# Patient Record
Sex: Female | Born: 1965 | Race: White | Hispanic: Yes | Marital: Married | Smoking: Never smoker
Health system: Southern US, Community
[De-identification: ages and names within clinical notes are randomized; demographics above are authoritative.]

## PROBLEM LIST (undated history)

## (undated) DIAGNOSIS — N200 Calculus of kidney: Secondary | ICD-10-CM

## (undated) HISTORY — PX: TUBAL LIGATION: SHX77

---

## 2011-12-16 ENCOUNTER — Ambulatory Visit (INDEPENDENT_AMBULATORY_CARE_PROVIDER_SITE_OTHER): Payer: Self-pay | Admitting: Physician Assistant

## 2011-12-16 ENCOUNTER — Encounter: Payer: Self-pay | Admitting: Physician Assistant

## 2011-12-16 VITALS — BP 156/93 | HR 107 | Temp 98.7°F | Ht 64.0 in | Wt 203.3 lb

## 2011-12-16 DIAGNOSIS — R300 Dysuria: Secondary | ICD-10-CM

## 2011-12-16 DIAGNOSIS — E119 Type 2 diabetes mellitus without complications: Secondary | ICD-10-CM | POA: Insufficient documentation

## 2011-12-16 MED ORDER — SULFAMETHOXAZOLE-TRIMETHOPRIM 800-160 MG PO TABS: 1.0000 | ORAL_TABLET | Freq: Two times a day (BID) | ORAL | Status: AC

## 2011-12-16 NOTE — Progress Notes (Signed)
Chief Complaint:  Follow-up   Angel Bradshaw is  46 y.o. G3P3003.  Patient's last menstrual period was 12/07/2011.. She presents complaining of Follow-up  Pt presents stating that she was told to FU in [facility] because her pap smear was abnormal. Review of records reveal NIL pap on 10/15/2012. 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  Pt reports no s/s at present, however, states that she has vaginal "bumps" and irritation with itching 2 days before period begins. Reports burning with urination x 1 week.  Obstetrical/Gynecological History: OB History    Grav Para Term Preterm Abortions TAB SAB Ect Mult Living   3 3 3       3       Past Medical History: Past Medical History  Diagnosis Date  . Diabetes mellitus     Past Surgical History: Past Surgical History  Procedure Date  . Tubal ligation     Family History: Family History  Problem Relation Age of Onset  . Diabetes Mother     Social History: History  Substance Use Topics  . Smoking status: Not on file  . Smokeless tobacco: Never Used  . Alcohol Use: No    Allergies: Allergies no known allergies   Review of Systems - Negative except what has been reviewed in the HPI  Physical Exam   Blood pressure 156/93, pulse 107, temperature 98.7 F (37.1 C), temperature source Oral, height 5\' 4"  (1.626 m), weight 203 lb 4.8 oz (92.216 kg), last menstrual period 12/07/2011.  General: General appearance - alert, well appearing, and in no distress, oriented to person, place, and time and normal appearing weight Mental status - alert, oriented to person, place, and time, normal mood, behavior, speech, dress, motor activity, and thought processes, affect appropriate to mood Abdomen - soft, nontender, nondistended, no masses or organomegaly no CVA tenderness Pelvic - normal external genitalia, vulva, vagina, cervix, uterus and adnexa, no s/s of warts or lesions on perineum   Assessment:  Dysuria Patient Active Problem List  Diagnoses  . Diabetes mellitus    Plan: Rx Bactrum DS 1 po BID x 3 days Urine C&S Reassurance given of NL pap. Baking soda soaks prn comfort with s/s.   Angel Marsalis E. 12/16/2011,3:02 PM

## 2011-12-16 NOTE — Patient Instructions (Signed)
Dolor plvico (Pelvic Pain) Es el dolor que se ubica debajo del ombligo, Center las caderas. El dolor agudo puede durar algunas horas o [place]. El dolor plvico crnico puede durar algunas semanas o meses. Las causas de los diferentes tipos de dolor pueden ser varias. El dolor puede ser sordo o [place], leve o intenso y puede interferir con las actividades diarias. Antelo y [place] a su mdico.   Exactamente donde se [place].   Si viene y se va, o permanece todo [address]   Cuando aparece (al [employer] sexuales, al orinar, al mover el intestino, etc.)   Si se relaciona con el perodo menstrual o situaciones de Librarian, academic.  El mdico le har una historia clnica [place], un examen fsico y un [place]. CAUSAS  Perodos menstruales dolorosos (dismenorrea).   Ovulacin normal, que ocurre en la mitad del perodo menstrual todos los meses.   Los rganos plvicos que se congestionan de sangre justo antes del perodo menstrual (sndrome congestivo plvico).   Tejido cicatrizal consecuencia de una infeccin o cirugas anteriores (adherencias plvicas).   Cncer en los rganos plvicos femeninos. Cuando el dolor es por cncer, ha aparecido hace mucho tiempo.   El tejido que tapiza internamente al tero (endometrio) crece de manera anormal en lugares como la pelvis y en los rganos plvicos (endometriosis),   Una forma de endometriosis con el tejido que recubre el interior del tero situado dentro de su tejido muscular (adenomiosis).   Tumores fibroides (no cancerosos) del tero.   Problemas en la vejiga, como infecciones, espasmos del tejido muscular de la vejiga.   Problemas intestinales (colon irritable, colitis, lcera o infeccin gastrointestinal).   Plipos en el cuello del tero.   Embarazo en las trompas (embarazo ectpico).   La apertura del cuello del tero es demasiado pequea para que fluya la sangre menstrual (estenosis cervical).   Maltratos fsicos o sexuales  (pasados o presentes).   Problemas msculoesquelticos debidos a Engineer, water, problemas con las vrtebras de la zona baja de la espalda o cada de los msculos de la pelvis (prolapso).   Problemas psicolgicos como depresin o estrs.   DIU (dispositivo intrauterino).  DIAGNSTICO Las pruebas para Sales executive diagnstico dependen del tipo, [place], gravedad y causa del Engineer, mining. Las pruebas necesarias son:  Anlisis de [place].   Anlisis de [place].   Ecografa.   Radiografas.   Tomografa computada.   MRI (imagen por resonancia magntica).   Laparoscopa.   Ciruga mayor.  TRATAMIENTO El tratamiento depender de la causa del dolor, el que incluye:  Analgsicos prescriptos o de [place].   Antibiticos:   Anticonceptivos.   Tratamiento hormonal.   Inyecciones para bloquear la conduccin nerviosa.   Fisioterapia.   Antidepresivos.   Psicoterapia con un psiquiatra o un psiclogo.   Ciruga mayor o menor.  INSTRUCCIONES PARA EL CUIDADO DOMICILIARIO  Slo tome medicamentos de venta libre o prescriptos para Primary school teacher, las molestias o bajar la fiebre segn las indicaciones de su mdico.   Siga los consejos de su mdico para tratar Chief Technology Officer.   Reposo.   Evite las relaciones sexuales si le Teaching laboratory technician.   Aplique compresas calientes o fras (que siempre son mejores) en la zona dolorida.   Haga ejercicios de relajacin, como yoga o meditacin.   Trate con acupuntura.   Evite las situaciones estresantes.   Trate con psicoterapia grupal.   Si el dolor se debe a molestias intestinales o estomacales, beba lquidos claros, siga una dieta blanda [employer]  que los sntomas desaparezcan.  SOLICITE ANTENCIN MDICA SI:  Necesita analgsicos ms fuertes.   Tiene dolor durante las [employer].   Tiene dolor al [employer].   La temperatura se eleva por encima de 102 F (38.9 C) cuando siente dolor.   An siente dolor luego de 4 horas e haber tomado  los analgsicos prescriptos.   Necesita antidepresivos.   El DIU le causa dolor y Teacher, English as a foreign language.  SOLICITE ATENCIN MDICA DE INMEDIATO SI:  Presenta dolor muy intenso.   Presenta se desmaya, tiene escalofros, debilidad extrema o deshidratacin.   Observa una hemorragia vaginal abundante o elimina tejidos.   Presenta la temperatura se eleva por encima de 102 F (38.9 C) cuando siente dolor.   Observa sangre en la orina.   Ha sufrido maltratos fsicos o sexuales.   Sufre vmitos y diarrea y no puede controlarlos.   Siente depresin y teme daarse a usted mismo o a Economist.  Document Released: 11/15/2005 Document Revised: 07/28/2011 [facility] Patient Information 2012 [place], [place].Pelvic Pain Pelvic pain is pain below the belly button and located between your hips. Acute pain may last a few hours or days. Chronic pelvic pain may last weeks and months. The cause may be different for different types of pain. The pain may be dull or sharp, mild or severe and can interfere with your daily activities. Write down and tell your caregiver:   Exactly where the pain is located.   If it comes and goes or is there all the time.   When it happens (with sex, urination, bowel movement, etc.)   If the pain is related to your menstrual period or stress.  Your caregiver will take a full history and do a complete physical exam and Pap test. CAUSES   Painful menstrual periods (dysmenorrhea).   Normal ovulation ([place]) that occurs in the middle of the menstrual cycle every month.   The pelvic organs get engorged with blood just before the menstrual period (pelvic congestive syndrome).   Scar tissue from an infection or past surgery (pelvic adhesions).   Cancer of the female pelvic organs. When there is pain with cancer, it has been there for a long time.   The lining of the uterus (endometrium) abnormally grows in places like the pelvis and on the pelvic organs  (endometriosis).   A form of endometriosis with the lining of the uterus present inside of the muscle tissue of the uterus (adenomyosis).   Fibroid tumor (noncancerous) in the uterus.   Bladder problems such as infection, bladder spasms of the muscle tissue of the bladder.   Intestinal problems (irritable bowel syndrome, colitis, an ulcer or gastrointestinal infection).   Polyps of the cervix or uterus.   Pregnancy in the tube (ectopic pregnancy).   The opening of the cervix is too small for the menstrual blood to flow through it (cervical stenosis).   Physical or sexual abuse (past or present).   Musculo-skeletal problems from poor posture, problems with the vertebrae of the lower back or the uterine pelvic muscles falling (prolapse).   Psychological problems such as depression or stress.   IUD (intrauterine device) in the uterus.  DIAGNOSIS  Tests to make a diagnosis depends on the type, location, severity and what causes the pain to occur. Tests that may be needed include:  Blood tests.   Urine tests   Ultrasound.   X-rays.   CT Scan.   MRI.   Laparoscopy.   Major surgery.  TREATMENT  Treatment will  depend on the cause of the pain, which includes:  Prescription or over-the-counter pain medication.   Antibiotics.   Birth control pills.   Hormone treatment.   Nerve blocking injections.   Physical therapy.   Antidepressants.   Counseling with a psychiatrist or psychologist.   Minor or major surgery.  HOME CARE INSTRUCTIONS   Only take over-the-counter or prescription medicines for pain, discomfort or fever as directed by your caregiver.   Follow your caregiver's advice to treat your pain.   Rest.   Avoid sexual intercourse if it causes the pain.   Apply warm or cold compresses (which ever works best) to the pain area.   Do relaxation exercises such as yoga or meditation.   Try acupuncture.   Avoid stressful situations.   Try group  therapy.   If the pain is because of a stomach/intestinal upset, drink clear liquids, eat a bland light food diet until the symptoms go away.  SEEK MEDICAL CARE IF:   You need stronger prescription pain medication.   You develop pain with sexual intercourse.   You have pain with urination.   You develop a temperature of 102 F (38.9 C) with the pain.   You are still in pain after 4 hours of taking prescription medication for the pain.   You need depression medication.   Your IUD is causing pain and you want it removed.  SEEK IMMEDIATE MEDICAL CARE IF:  You develop very severe pain or tenderness.   You faint, have chills, severe weakness or dehydration.   You develop heavy vaginal bleeding or passing solid tissue.   You develop a temperature of 102 F (38.9 C) with the pain.   You have blood in the urine.   You are being physically or sexually abused.   You have uncontrolled vomiting and diarrhea.   You are depressed and afraid of harming yourself or someone else.  Document Released: 12/23/2004 Document Revised: 07/28/2011 Document Reviewed: 09/19/2008 [facility] Patient Information 2012 [place], [place].

## 2011-12-19 LAB — URINE CULTURE

## 2013-10-18 ENCOUNTER — Inpatient Hospital Stay (HOSPITAL_BASED_OUTPATIENT_CLINIC_OR_DEPARTMENT_OTHER)
Admission: EM | Admit: 2013-10-18 | Discharge: 2013-10-20 | DRG: 419 | Disposition: A | Discharge status: Home / Self Care | Payer: BC Managed Care – PPO | Attending: General Surgery | Admitting: General Surgery

## 2013-10-18 ENCOUNTER — Emergency Department (HOSPITAL_BASED_OUTPATIENT_CLINIC_OR_DEPARTMENT_OTHER): Payer: BC Managed Care – PPO

## 2013-10-18 ENCOUNTER — Encounter (HOSPITAL_BASED_OUTPATIENT_CLINIC_OR_DEPARTMENT_OTHER): Payer: Self-pay | Admitting: Emergency Medicine

## 2013-10-18 DIAGNOSIS — E119 Type 2 diabetes mellitus without complications: Secondary | ICD-10-CM | POA: Diagnosis present

## 2013-10-18 DIAGNOSIS — K432 Incisional hernia without obstruction or gangrene: Secondary | ICD-10-CM | POA: Diagnosis present

## 2013-10-18 DIAGNOSIS — K8010 Calculus of gallbladder with chronic cholecystitis without obstruction: Principal | ICD-10-CM | POA: Diagnosis present

## 2013-10-18 DIAGNOSIS — K8020 Calculus of gallbladder without cholecystitis without obstruction: Secondary | ICD-10-CM

## 2013-10-18 DIAGNOSIS — E041 Nontoxic single thyroid nodule: Secondary | ICD-10-CM

## 2013-10-18 DIAGNOSIS — N390 Urinary tract infection, site not specified: Secondary | ICD-10-CM | POA: Diagnosis present

## 2013-10-18 DIAGNOSIS — E669 Obesity, unspecified: Secondary | ICD-10-CM | POA: Diagnosis present

## 2013-10-18 DIAGNOSIS — Z833 Family history of diabetes mellitus: Secondary | ICD-10-CM

## 2013-10-18 DIAGNOSIS — K812 Acute cholecystitis with chronic cholecystitis: Secondary | ICD-10-CM | POA: Diagnosis present

## 2013-10-18 DIAGNOSIS — K429 Umbilical hernia without obstruction or gangrene: Secondary | ICD-10-CM | POA: Diagnosis present

## 2013-10-18 DIAGNOSIS — Z9851 Tubal ligation status: Secondary | ICD-10-CM

## 2013-10-18 DIAGNOSIS — R12 Heartburn: Secondary | ICD-10-CM

## 2013-10-18 LAB — URINALYSIS, ROUTINE W REFLEX MICROSCOPIC
Bilirubin Urine: NEGATIVE
Ketones, ur: NEGATIVE mg/dL
Nitrite: NEGATIVE
Protein, ur: NEGATIVE mg/dL

## 2013-10-18 LAB — COMPREHENSIVE METABOLIC PANEL
BUN: 10 mg/dL (ref 6–23)
CO2: 24 mEq/L (ref 19–32)
Calcium: 9.5 mg/dL (ref 8.4–10.5)
Creatinine, Ser: 0.5 mg/dL (ref 0.50–1.10)
GFR calc Af Amer: >90 mL/min (ref >90)
GFR calc non Af Amer: >90 mL/min (ref >90)
Glucose, Bld: 126 mg/dL — ABNORMAL HIGH (ref 70–99)
Potassium: 3.7 mEq/L (ref 3.5–5.1)
Total Protein: 7.8 g/dL (ref 6.0–8.3)

## 2013-10-18 LAB — URINE MICROSCOPIC-ADD ON

## 2013-10-18 LAB — CBC WITH DIFFERENTIAL/PLATELET
Basophils Relative: 0 % (ref 0–1)
Eosinophils Absolute: 0.1 10*3/uL (ref 0.0–0.7)
Eosinophils Relative: 1 % (ref 0–5)
HCT: 40.3 % (ref 36.0–46.0)
Lymphocytes Relative: 25 % (ref 12–46)
Lymphs Abs: 2.9 10*3/uL (ref 0.7–4.0)
MCH: 27.2 pg (ref 26.0–34.0)
MCHC: 33.5 g/dL (ref 30.0–36.0)
MCV: 81.3 fL (ref 78.0–100.0)
Monocytes Absolute: 0.7 10*3/uL (ref 0.1–1.0)
Neutrophils Relative %: 69 % (ref 43–77)
Platelets: 338 10*3/uL (ref 150–400)
RBC: 4.96 MIL/uL (ref 3.87–5.11)
RDW: 13.5 % (ref 11.5–15.5)
WBC: 11.6 10*3/uL — ABNORMAL HIGH (ref 4.0–10.5)

## 2013-10-18 LAB — GLUCOSE, CAPILLARY: Glucose-Capillary: 103 mg/dL — ABNORMAL HIGH (ref 70–99)

## 2013-10-18 LAB — POCT I-STAT 3, VENOUS BLOOD GAS (G3P V)
Acid-Base Excess: 1 mmol/L (ref 0.0–2.0)
Bicarbonate: 24.8 mEq/L — ABNORMAL HIGH (ref 20.0–24.0)
O2 Saturation: 82 %
Patient temperature: 98.6

## 2013-10-18 LAB — TROPONIN I: Troponin I: <0.3 ng/mL (ref <0.30)

## 2013-10-18 LAB — KETONES, QUALITATIVE: Acetone, Bld: NEGATIVE

## 2013-10-18 LAB — LIPASE, BLOOD: Lipase: 38 U/L (ref 11–59)

## 2013-10-18 LAB — CG4 I-STAT (LACTIC ACID): Lactic Acid, Venous: 1.1 mmol/L (ref 0.5–2.2)

## 2013-10-18 MED ORDER — PIPERACILLIN-TAZOBACTAM 3.375 G IVPB
3.3750 g | Freq: Once | INTRAVENOUS | Status: AC
  Administered 2013-10-18: 3.375 g via INTRAVENOUS
  Filled 2013-10-18 (×2): qty 50

## 2013-10-18 MED ORDER — IOHEXOL 300 MG/ML  SOLN
50.0000 mL | Freq: Once | INTRAMUSCULAR | Status: AC | PRN
  Administered 2013-10-18: 50 mL via ORAL

## 2013-10-18 MED ORDER — DEXTROSE 5 % IV SOLN
1.0000 g | Freq: Once | INTRAVENOUS | Status: AC
  Administered 2013-10-18: 1 g via INTRAVENOUS

## 2013-10-18 MED ORDER — ONDANSETRON HCL 4 MG/2ML IJ SOLN
4.0000 mg | Freq: Once | INTRAMUSCULAR | Status: AC
  Administered 2013-10-18: 4 mg via INTRAVENOUS
  Filled 2013-10-18: qty 2

## 2013-10-18 MED ORDER — IOHEXOL 350 MG/ML SOLN
100.0000 mL | Freq: Once | INTRAVENOUS | Status: AC | PRN
  Administered 2013-10-18: 100 mL via INTRAVENOUS

## 2013-10-18 MED ORDER — CEFTRIAXONE SODIUM 1 G IJ SOLR
INTRAMUSCULAR | Status: AC
  Filled 2013-10-18: qty 10

## 2013-10-18 MED ORDER — SODIUM CHLORIDE 0.9 % IV BOLUS (SEPSIS)
1000.0000 mL | Freq: Once | INTRAVENOUS | Status: AC
  Administered 2013-10-18: 1000 mL via INTRAVENOUS

## 2013-10-18 MED ORDER — MORPHINE SULFATE 4 MG/ML IJ SOLN
4.0000 mg | Freq: Once | INTRAMUSCULAR | Status: AC
  Administered 2013-10-18: 4 mg via INTRAVENOUS
  Filled 2013-10-18: qty 1

## 2013-10-18 NOTE — ED Notes (Signed)
Epigastric pain and pain in her left flank. Denies dysuria.

## 2013-10-18 NOTE — ED Notes (Signed)
Report given to Shawn RN with [facility] and RN in charge at [facility].

## 2013-10-18 NOTE — ED Provider Notes (Signed)
CSN: [id number]     Arrival date & time 10/18/13  1727 History   First MD Initiated Contact with Patient 10/18/13 1738     Chief Complaint  Patient presents with  . Abdominal Pain   (Consider location/radiation/quality/duration/timing/severity/associated sxs/prior Treatment) HPI Comments: Patient presents with three-day history of epigastric and upper abdominal pain is intermittent. It started 2 days ago progressed throughout the day, will wait overnight and came back during the day. Nothing makes the pain better or worse. Today she had one episode of vomiting. Denies any fevers, chills, dysuria hematuria. Denies any vaginal bleeding or discharge. Denies any chest pain or shortness of breath. She has not had pain like this in the past.   The history is provided by the patient.    Past Medical History  Diagnosis Date  . Diabetes mellitus    Past Surgical History  Procedure Laterality Date  . Tubal ligation     Family History  Problem Relation Age of Onset  . Diabetes Mother    History  Substance Use Topics  . Smoking status: Never Smoker   . Smokeless tobacco: Never Used  . Alcohol Use: No   OB History   Grav Para Term Preterm Abortions TAB SAB Ect Mult Living   3 3 3       3      Review of Systems  Constitutional: Negative for fever, activity change and appetite change.  HENT: Negative for rhinorrhea.   Eyes: Negative for visual disturbance.  Respiratory: Negative for cough and shortness of breath.   Cardiovascular: Negative for chest pain.  Gastrointestinal: Positive for nausea, vomiting and abdominal pain. Negative for diarrhea and constipation.  Genitourinary: Negative for dysuria, hematuria, vaginal bleeding and vaginal discharge.  Musculoskeletal: Negative for back pain.  Skin: Negative for rash.  Neurological: Negative for dizziness, weakness and headaches.  A complete 10 system review of systems was obtained and all systems are negative except as noted in the HPI  and PMH.    Allergies  Review of patient's allergies indicates no known allergies.  Home Medications   Current Outpatient Rx  Name  Route  Sig  Dispense  Refill  . Canagliflozin (INVOKANA) 100 MG TABS   Oral   Take by mouth.         . sitaGLIPtin (JANUVIA) 100 MG tablet   Oral   Take 100 mg by mouth daily.         . fish oil-omega-3 fatty acids 1000 MG capsule   Oral   Take 1 g by mouth daily.         . metFORMIN (GLUCOPHAGE) 1000 MG tablet   Oral   Take 1,000 mg by mouth 2 (two) times daily with a meal.          BP 139/82  Pulse 83  Temp(Src) 98.6 F (37 C) (Oral)  Resp 20  Wt 178 lb (80.74 kg)  SpO2 100%  LMP 10/02/2013 Physical Exam  Constitutional: She is oriented to person, place, and time. She appears well-developed and well-nourished. No distress.  HENT:  Head: Normocephalic and atraumatic.  Mouth/Throat: Oropharynx is clear and moist. No oropharyngeal exudate.  Eyes: Conjunctivae and EOM are normal. Pupils are equal, round, and reactive to light.  Neck: Normal range of motion. Neck supple.  Cardiovascular: Normal rate, regular rhythm and normal heart sounds.   No murmur heard. Pulmonary/Chest: Effort normal and breath sounds normal. No respiratory distress. She exhibits no tenderness.  tachycardic  Abdominal: Soft. There is  tenderness. There is no rebound and no guarding.  TTP epigastrum, RUQ, LUQ Soft No lower abdominal tenderness  Musculoskeletal: Normal range of motion. She exhibits no edema and no tenderness.  No CVAT  Neurological: She is alert and oriented to person, place, and time. No cranial nerve deficit. She exhibits normal muscle tone. Coordination normal.  Skin: Skin is warm.    ED Course  Procedures (including critical care time) Labs Review Labs Reviewed  URINALYSIS, ROUTINE W REFLEX MICROSCOPIC - Abnormal; Notable for the following:    APPearance CLOUDY (*)    Glucose, UA >1000 (*)    Hgb urine dipstick TRACE (*)     Leukocytes, UA MODERATE (*)    All other components within normal limits  CBC WITH DIFFERENTIAL - Abnormal; Notable for the following:    WBC 11.6 (*)    Neutro Abs 7.9 (*)    All other components within normal limits  COMPREHENSIVE METABOLIC PANEL - Abnormal; Notable for the following:    Glucose, Bld 126 (*)    Total Bilirubin 0.1 (*)    All other components within normal limits  GLUCOSE, CAPILLARY - Abnormal; Notable for the following:    Glucose-Capillary 118 (*)    All other components within normal limits  GLUCOSE, CAPILLARY - Abnormal; Notable for the following:    Glucose-Capillary 103 (*)    All other components within normal limits  POCT I-STAT 3, BLOOD GAS (G3P V) - Abnormal; Notable for the following:    pH, Ven 7.429 (*)    pCO2, Ven 37.4 (*)    Bicarbonate 24.8 (*)    All other components within normal limits  URINE CULTURE  PREGNANCY, URINE  LIPASE, BLOOD  KETONES, QUALITATIVE  TROPONIN I  URINE MICROSCOPIC-ADD ON  CG4 I-STAT (LACTIC ACID)   Imaging Review Ct Angio Chest Pe W/cm &/or Wo Cm  10/18/2013   CLINICAL DATA:  Epigastric pain. Pleuritic left flank pain. Abdominal pain.  EXAM: CT ANGIOGRAPHY CHEST  CT ABDOMEN AND PELVIS WITH CONTRAST  TECHNIQUE: Multidetector CT imaging of the chest was performed using the standard protocol during bolus administration of intravenous contrast. Multiplanar CT image reconstructions including MIPs were obtained to evaluate the vascular anatomy. Multidetector CT imaging of the abdomen and pelvis was performed using the standard protocol during bolus administration of intravenous contrast.  CONTRAST:  50mL OMNIPAQUE IOHEXOL 300 MG/ML SOLN, OMNIPAQUE IOHEXOL 350 MG/ML SOLN  COMPARISON:  10/18/2013 abdominal ultrasound.  FINDINGS: CTA CHEST FINDINGS  Low-density prominence in the left thyroid lobe measures 1.7 cm and in the right thyroid lobe measures 1.8 cm.  No filling defect is identified in the pulmonary arterial tree to  suggest pulmonary embolus. No acute aortic findings. No pericardial effusion. No pathologic thoracic adenopathy.  The lungs appear clear.  No pleural effusion.  CT ABDOMEN and PELVIS FINDINGS  The liver, spleen, pancreas, and adrenal glands appear unremarkable. Borderline gallbladder wall thickening. Kidneys and proximal ureters unremarkable. No pathologic upper abdominal adenopathy is observed. Small umbilical hernia contains adipose tissue. Appendix normal.  2.5 cm fluid density lesion along the left ovary margin. Urinary bladder mildly distended but otherwise unremarkable.  Posterior osseous ridging noted at L5-S1, with foraminal components contributing to mild foraminal stenosis.  Review of the MIP images confirms the above findings.  IMPRESSION: 1. Bilateral low-density thyroid lesions. Consider further evaluation with thyroid ultrasound. If patient is clinically hyperthyroid, consider nuclear medicine thyroid uptake and scan. 2. Borderline gallbladder wall thickening. 3. 2.5 cm fluid density lesion  of the left ovary. Based on size criteria, this does not require followup. This recommendation follows ACR consensus guidelines: White Paper of the ACR Incidental Findings Committee II on Adnexal Findings. J Am Coll Radiol [phone]. 4. Posterior osseous ridging at L5-S1, likely causing mild bilateral foraminal stenosis. 5. Small umbilical hernia contains adipose tissue.   Electronically Signed   By: Herbie Baltimore M.D.   On: 10/18/2013 22:00   US Abdomen Complete  10/18/2013   CLINICAL DATA:  Right upper quadrant/epigastric pain. History of diabetes.  EXAM: ULTRASOUND ABDOMEN COMPLETE  COMPARISON:  None.  FINDINGS: Gallbladder  numerous gallstones with posterior shadowing, each measuring up to about 1.6 cm in diameter. Borderline gallbladder wall thickening at 3 mm. No pericholecystic fluid. Sonographic Murphy's sign: Absent.  Common bile duct  Diameter: 4 mm, within normal limits.  Liver  No focal  lesion identified. Within normal limits in parenchymal echogenicity.  IVC  Limited visualization due to overlying bowel gas.  .  Pancreas  Limited visualization due to overlying bowel gas; visualized portion of pancreatic head and body unremarkable. Marland Kitchen  Spleen  Size and appearance within normal limits.  Right Kidney  Length: 13.9 cm. Echogenicity within normal limits. No mass or hydronephrosis visualized.  Left Kidney  Length: 13.0 cm. Echogenicity within normal limits. No mass or hydronephrosis visualized.  Abdominal aorta  No aneurysm visualized.  IMPRESSION: 1. Cholelithiasis, with borderline gallbladder wall thickening. No pericholecystic fluid or sonographic Murphy sign. Correlate clinically in assessing for acute cholecystitis. 2. Overlying bowel gas causes limited visualization of the IVC and pancreas.   Electronically Signed   By: Herbie Baltimore M.D.   On: 10/18/2013 18:55   Ct Abdomen Pelvis W Contrast  10/18/2013   CLINICAL DATA:  Epigastric pain. Pleuritic left flank pain. Abdominal pain.  EXAM: CT ANGIOGRAPHY CHEST  CT ABDOMEN AND PELVIS WITH CONTRAST  TECHNIQUE: Multidetector CT imaging of the chest was performed using the standard protocol during bolus administration of intravenous contrast. Multiplanar CT image reconstructions including MIPs were obtained to evaluate the vascular anatomy. Multidetector CT imaging of the abdomen and pelvis was performed using the standard protocol during bolus administration of intravenous contrast.  CONTRAST:  50mL OMNIPAQUE IOHEXOL 300 MG/ML SOLN, OMNIPAQUE IOHEXOL 350 MG/ML SOLN  COMPARISON:  10/18/2013 abdominal ultrasound.  FINDINGS: CTA CHEST FINDINGS  Low-density prominence in the left thyroid lobe measures 1.7 cm and in the right thyroid lobe measures 1.8 cm.  No filling defect is identified in the pulmonary arterial tree to suggest pulmonary embolus. No acute aortic findings. No pericardial effusion. No pathologic thoracic adenopathy.  The lungs  appear clear.  No pleural effusion.  CT ABDOMEN and PELVIS FINDINGS  The liver, spleen, pancreas, and adrenal glands appear unremarkable. Borderline gallbladder wall thickening. Kidneys and proximal ureters unremarkable. No pathologic upper abdominal adenopathy is observed. Small umbilical hernia contains adipose tissue. Appendix normal.  2.5 cm fluid density lesion along the left ovary margin. Urinary bladder mildly distended but otherwise unremarkable.  Posterior osseous ridging noted at L5-S1, with foraminal components contributing to mild foraminal stenosis.  Review of the MIP images confirms the above findings.  IMPRESSION: 1. Bilateral low-density thyroid lesions. Consider further evaluation with thyroid ultrasound. If patient is clinically hyperthyroid, consider nuclear medicine thyroid uptake and scan. 2. Borderline gallbladder wall thickening. 3. 2.5 cm fluid density lesion of the left ovary. Based on size criteria, this does not require followup. This recommendation follows ACR consensus guidelines: White Paper of the ACR Incidental Findings  Committee II on Adnexal Findings. J Am Coll Radiol [id number]. 4. Posterior osseous ridging at L5-S1, likely causing mild bilateral foraminal stenosis. 5. Small umbilical hernia contains adipose tissue.   Electronically Signed   By: Herbie Baltimore M.D.   On: 10/18/2013 22:00    EKG Interpretation    Date/Time:  Thursday October 18 2013 17:56:40 EST Ventricular Rate:  94 PR Interval:  142 QRS Duration: 100 QT Interval:  388 QTC Calculation: 485 R Axis:   -17 Text Interpretation:  Normal sinus rhythm with sinus arrhythmia Abnormal ECG No previous ECGs available Confirmed by Manus Gunning  MD, Damiana Berrian ([id number]) on 10/18/2013 6:00:51 PM            MDM   1. Cholelithiasis    Epigastric and right upper quadrant abdominal pain with nausea and one episode of vomiting. Vitals stable. No distress. No peritoneal signs  Check labs, rule out DKA, check  gallbladder ultrasound  UA appears to be infected.  No evidence of DKA.  No ketones in urine. Rocephin given. LFTs and lipase normal. WBC count 11. Ultrasound shows multiple gallstones with borderline thickened gallbladder wall. She is tender in the right upper quadrant. However she also has left upper quadrant tenderness in thoracic back tenderness that is worse with inspiration.  CT did not show any additional pathology. Questionable thyroid nodules. Discussed with Dr. gross who will evaluate her for cholecystitis as she still having right upper quadrant pain. Dr. Effie Shy accepts patient to the ER at [facility].  Glynn Octave, MD 10/18/13 [phone]

## 2013-10-18 NOTE — ED Notes (Signed)
Rocephin is infusing at present time.

## 2013-10-18 NOTE — ED Notes (Signed)
Patient arrived from [facility] via [facility]  Patient in NAD upon arrival Patient denies complaints or needs at this time Side rails up, call bell in reach

## 2013-10-18 NOTE — ED Notes (Signed)
Bed: WU98 Expected date: 10/18/13 Expected time: 11:09 PM Means of arrival: Ambulance Comments: Transfer from [facility]

## 2013-10-19 ENCOUNTER — Encounter (HOSPITAL_COMMUNITY): Admission: EM | Disposition: A | Discharge status: Home / Self Care | Payer: Self-pay | Source: Home / Self Care

## 2013-10-19 ENCOUNTER — Inpatient Hospital Stay (HOSPITAL_COMMUNITY): Payer: BC Managed Care – PPO

## 2013-10-19 ENCOUNTER — Inpatient Hospital Stay (HOSPITAL_COMMUNITY): Payer: BC Managed Care – PPO | Admitting: Anesthesiology

## 2013-10-19 ENCOUNTER — Encounter (HOSPITAL_COMMUNITY): Payer: BC Managed Care – PPO | Admitting: Anesthesiology

## 2013-10-19 DIAGNOSIS — R12 Heartburn: Secondary | ICD-10-CM

## 2013-10-19 DIAGNOSIS — N390 Urinary tract infection, site not specified: Secondary | ICD-10-CM

## 2013-10-19 DIAGNOSIS — K432 Incisional hernia without obstruction or gangrene: Secondary | ICD-10-CM

## 2013-10-19 DIAGNOSIS — E669 Obesity, unspecified: Secondary | ICD-10-CM | POA: Diagnosis present

## 2013-10-19 DIAGNOSIS — K8010 Calculus of gallbladder with chronic cholecystitis without obstruction: Secondary | ICD-10-CM

## 2013-10-19 DIAGNOSIS — K812 Acute cholecystitis with chronic cholecystitis: Secondary | ICD-10-CM | POA: Diagnosis present

## 2013-10-19 HISTORY — PX: CHOLECYSTECTOMY: SHX55

## 2013-10-19 LAB — CBC
MCH: 27.9 pg (ref 26.0–34.0)
MCHC: 34 g/dL (ref 30.0–36.0)
MCV: 82.1 fL (ref 78.0–100.0)
Platelets: 329 10*3/uL (ref 150–400)
WBC: 10 10*3/uL (ref 4.0–10.5)

## 2013-10-19 LAB — GLUCOSE, CAPILLARY
Glucose-Capillary: 110 mg/dL — ABNORMAL HIGH (ref 70–99)
Glucose-Capillary: 139 mg/dL — ABNORMAL HIGH (ref 70–99)
Glucose-Capillary: 147 mg/dL — ABNORMAL HIGH (ref 70–99)
Glucose-Capillary: 185 mg/dL — ABNORMAL HIGH (ref 70–99)

## 2013-10-19 LAB — URINE CULTURE
Colony Count: NO GROWTH
Culture: NO GROWTH

## 2013-10-19 LAB — HEPATIC FUNCTION PANEL
ALT: 13 U/L (ref 0–35)
Alkaline Phosphatase: 45 U/L (ref 39–117)
Bilirubin, Direct: 0.1 mg/dL (ref 0.0–0.3)
Indirect Bilirubin: 0.1 mg/dL — ABNORMAL LOW (ref 0.3–0.9)
Total Protein: 6.5 g/dL (ref 6.0–8.3)

## 2013-10-19 LAB — SURGICAL PCR SCREEN: Staphylococcus aureus: POSITIVE — AB

## 2013-10-19 LAB — HEMOGLOBIN A1C: Hgb A1c MFr Bld: 6.5 % — ABNORMAL HIGH (ref <5.7)

## 2013-10-19 SURGERY — LAPAROSCOPIC CHOLECYSTECTOMY
Anesthesia: General | Site: Abdomen | Wound class: Clean Contaminated

## 2013-10-19 MED ORDER — BUPIVACAINE HCL 0.5 % IJ SOLN
INTRAMUSCULAR | Status: DC | PRN
  Administered 2013-10-19: 5 mL

## 2013-10-19 MED ORDER — SODIUM CHLORIDE 0.9 % IV SOLN
3.0000 g | Freq: Four times a day (QID) | INTRAVENOUS | Status: DC
  Administered 2013-10-19 (×2): 3 g via INTRAVENOUS
  Filled 2013-10-19 (×4): qty 3

## 2013-10-19 MED ORDER — SACCHAROMYCES BOULARDII 250 MG PO CAPS
250.0000 mg | ORAL_CAPSULE | Freq: Two times a day (BID) | ORAL | Status: DC
  Administered 2013-10-19 – 2013-10-20 (×2): 250 mg via ORAL
  Filled 2013-10-19 (×5): qty 1

## 2013-10-19 MED ORDER — KETOROLAC TROMETHAMINE 30 MG/ML IJ SOLN: 15.0000 mg | Freq: Once | INTRAMUSCULAR | Status: DC | PRN

## 2013-10-19 MED ORDER — HEPARIN SODIUM (PORCINE) 5000 UNIT/ML IJ SOLN
5000.0000 [IU] | Freq: Three times a day (TID) | INTRAMUSCULAR | Status: DC
  Administered 2013-10-19 – 2013-10-20 (×3): 5000 [IU] via SUBCUTANEOUS
  Filled 2013-10-19 (×8): qty 1

## 2013-10-19 MED ORDER — GLYCOPYRROLATE 0.2 MG/ML IJ SOLN
INTRAMUSCULAR | Status: AC
  Filled 2013-10-19: qty 3

## 2013-10-19 MED ORDER — PSYLLIUM 95 % PO PACK
1.0000 | PACK | Freq: Two times a day (BID) | ORAL | Status: DC
  Filled 2013-10-19 (×2): qty 1

## 2013-10-19 MED ORDER — FENTANYL CITRATE 0.05 MG/ML IJ SOLN
INTRAMUSCULAR | Status: AC
  Filled 2013-10-19: qty 5

## 2013-10-19 MED ORDER — MUPIROCIN 2 % EX OINT
1.0000 "application " | TOPICAL_OINTMENT | Freq: Two times a day (BID) | CUTANEOUS | Status: DC
  Administered 2013-10-19 – 2013-10-20 (×2): 1 via NASAL
  Filled 2013-10-19: qty 22

## 2013-10-19 MED ORDER — ONDANSETRON HCL 4 MG/2ML IJ SOLN: 4.0000 mg | Freq: Four times a day (QID) | INTRAMUSCULAR | Status: DC | PRN

## 2013-10-19 MED ORDER — LIP MEDEX EX OINT
1.0000 "application " | TOPICAL_OINTMENT | Freq: Two times a day (BID) | CUTANEOUS | Status: DC
  Administered 2013-10-19 – 2013-10-20 (×3): 1 via TOPICAL
  Filled 2013-10-19 (×2): qty 7

## 2013-10-19 MED ORDER — BISACODYL 10 MG RE SUPP: 10.0000 mg | Freq: Two times a day (BID) | RECTAL | Status: DC | PRN

## 2013-10-19 MED ORDER — KCL IN DEXTROSE-NACL 40-5-0.45 MEQ/L-%-% IV SOLN
INTRAVENOUS | Status: DC
  Administered 2013-10-19 (×3): via INTRAVENOUS
  Filled 2013-10-19 (×5): qty 1000

## 2013-10-19 MED ORDER — INSULIN ASPART 100 UNIT/ML ~~LOC~~ SOLN
0.0000 [IU] | Freq: Three times a day (TID) | SUBCUTANEOUS | Status: DC
  Administered 2013-10-19: 3 [IU] via SUBCUTANEOUS
  Administered 2013-10-20: 2 [IU] via SUBCUTANEOUS

## 2013-10-19 MED ORDER — GUAIFENESIN-DM 100-10 MG/5ML PO SYRP: 15.0000 mL | ORAL_SOLUTION | ORAL | Status: DC | PRN

## 2013-10-19 MED ORDER — LACTATED RINGERS IV SOLN
INTRAVENOUS | Status: DC
  Administered 2013-10-19: 1000 mL via INTRAVENOUS

## 2013-10-19 MED ORDER — NEOSTIGMINE METHYLSULFATE 1 MG/ML IJ SOLN
INTRAMUSCULAR | Status: AC
  Filled 2013-10-19: qty 10

## 2013-10-19 MED ORDER — LACTATED RINGERS IR SOLN
Status: DC | PRN
  Administered 2013-10-19: 1000 mL

## 2013-10-19 MED ORDER — CHLORHEXIDINE GLUCONATE CLOTH 2 % EX PADS
6.0000 | MEDICATED_PAD | Freq: Every day | CUTANEOUS | Status: DC
Start: 2013-10-19 — End: 2013-10-19
  Administered 2013-10-19: 6 via TOPICAL

## 2013-10-19 MED ORDER — MAGIC MOUTHWASH
15.0000 mL | Freq: Four times a day (QID) | ORAL | Status: DC | PRN
  Filled 2013-10-19: qty 15

## 2013-10-19 MED ORDER — FENTANYL CITRATE 0.05 MG/ML IJ SOLN
INTRAMUSCULAR | Status: DC | PRN
  Administered 2013-10-19: 50 ug via INTRAVENOUS
  Administered 2013-10-19: 125 ug via INTRAVENOUS
  Administered 2013-10-19 (×2): 100 ug via INTRAVENOUS

## 2013-10-19 MED ORDER — ACETAMINOPHEN 325 MG PO TABS: 650.0000 mg | ORAL_TABLET | Freq: Four times a day (QID) | ORAL | Status: DC | PRN

## 2013-10-19 MED ORDER — PROPOFOL 10 MG/ML IV BOLUS
INTRAVENOUS | Status: DC | PRN
  Administered 2013-10-19: 200 mg via INTRAVENOUS

## 2013-10-19 MED ORDER — SUCCINYLCHOLINE CHLORIDE 20 MG/ML IJ SOLN
INTRAMUSCULAR | Status: DC | PRN
  Administered 2013-10-19: 100 mg via INTRAVENOUS

## 2013-10-19 MED ORDER — PROMETHAZINE HCL 25 MG/ML IJ SOLN: 6.2500 mg | INTRAMUSCULAR | Status: DC | PRN

## 2013-10-19 MED ORDER — NEOSTIGMINE METHYLSULFATE 1 MG/ML IJ SOLN
INTRAMUSCULAR | Status: DC | PRN
  Administered 2013-10-19: 5 mg via INTRAVENOUS

## 2013-10-19 MED ORDER — LACTATED RINGERS IV SOLN
INTRAVENOUS | Status: DC | PRN
  Administered 2013-10-19: 10:00:00 via INTRAVENOUS

## 2013-10-19 MED ORDER — LIDOCAINE HCL (CARDIAC) 20 MG/ML IV SOLN
INTRAVENOUS | Status: DC | PRN
  Administered 2013-10-19: 50 mg via INTRAVENOUS

## 2013-10-19 MED ORDER — CHLORHEXIDINE GLUCONATE CLOTH 2 % EX PADS
5.0000 | MEDICATED_PAD | Freq: Every day | CUTANEOUS | Status: DC
  Administered 2013-10-20: 5 via TOPICAL

## 2013-10-19 MED ORDER — IOHEXOL 300 MG/ML  SOLN
INTRAMUSCULAR | Status: DC | PRN
  Administered 2013-10-19: 4 mL

## 2013-10-19 MED ORDER — ZOLPIDEM TARTRATE 5 MG PO TABS: 5.0000 mg | ORAL_TABLET | Freq: Every evening | ORAL | Status: DC | PRN

## 2013-10-19 MED ORDER — OXYCODONE HCL 5 MG PO TABS
5.0000 mg | ORAL_TABLET | ORAL | Status: DC | PRN
  Administered 2013-10-19: 5 mg via ORAL
  Administered 2013-10-20 (×2): 10 mg via ORAL
  Filled 2013-10-19 (×2): qty 2
  Filled 2013-10-19: qty 1

## 2013-10-19 MED ORDER — BUPIVACAINE HCL (PF) 0.5 % IJ SOLN
INTRAMUSCULAR | Status: AC
  Filled 2013-10-19: qty 30

## 2013-10-19 MED ORDER — METOPROLOL TARTRATE 1 MG/ML IV SOLN
5.0000 mg | Freq: Four times a day (QID) | INTRAVENOUS | Status: DC | PRN
  Filled 2013-10-19: qty 5

## 2013-10-19 MED ORDER — PROPOFOL 10 MG/ML IV BOLUS
INTRAVENOUS | Status: AC
  Filled 2013-10-19: qty 20

## 2013-10-19 MED ORDER — MIDAZOLAM HCL 2 MG/2ML IJ SOLN
INTRAMUSCULAR | Status: AC
  Filled 2013-10-19: qty 2

## 2013-10-19 MED ORDER — LACTATED RINGERS IV BOLUS (SEPSIS): 1000.0000 mL | Freq: Three times a day (TID) | INTRAVENOUS | Status: DC | PRN

## 2013-10-19 MED ORDER — ONDANSETRON HCL 4 MG/2ML IJ SOLN
INTRAMUSCULAR | Status: DC | PRN
  Administered 2013-10-19: 4 mg via INTRAVENOUS

## 2013-10-19 MED ORDER — MIDAZOLAM HCL 5 MG/5ML IJ SOLN
INTRAMUSCULAR | Status: DC | PRN
  Administered 2013-10-19: 2 mg via INTRAVENOUS

## 2013-10-19 MED ORDER — METOCLOPRAMIDE HCL 5 MG/ML IJ SOLN
INTRAMUSCULAR | Status: AC
  Filled 2013-10-19: qty 2

## 2013-10-19 MED ORDER — HYDROMORPHONE HCL PF 1 MG/ML IJ SOLN
0.5000 mg | INTRAMUSCULAR | Status: DC | PRN
  Administered 2013-10-19: 2 mg via INTRAVENOUS
  Filled 2013-10-19: qty 2

## 2013-10-19 MED ORDER — HYDROMORPHONE HCL PF 1 MG/ML IJ SOLN: 0.2500 mg | INTRAMUSCULAR | Status: DC | PRN

## 2013-10-19 MED ORDER — METOCLOPRAMIDE HCL 5 MG/ML IJ SOLN
INTRAMUSCULAR | Status: DC | PRN
  Administered 2013-10-19: 10 mg via INTRAVENOUS

## 2013-10-19 MED ORDER — GLYCOPYRROLATE 0.2 MG/ML IJ SOLN
INTRAMUSCULAR | Status: DC | PRN
  Administered 2013-10-19: 0.6 mg via INTRAVENOUS

## 2013-10-19 MED ORDER — DEXAMETHASONE SODIUM PHOSPHATE 10 MG/ML IJ SOLN
INTRAMUSCULAR | Status: AC
  Filled 2013-10-19: qty 1

## 2013-10-19 MED ORDER — ALUM & MAG HYDROXIDE-SIMETH 200-200-20 MG/5ML PO SUSP: 30.0000 mL | Freq: Four times a day (QID) | ORAL | Status: DC | PRN

## 2013-10-19 MED ORDER — PROMETHAZINE HCL 25 MG/ML IJ SOLN
12.5000 mg | Freq: Four times a day (QID) | INTRAMUSCULAR | Status: DC | PRN
  Filled 2013-10-19: qty 1

## 2013-10-19 MED ORDER — ROCURONIUM BROMIDE 100 MG/10ML IV SOLN
INTRAVENOUS | Status: DC | PRN
  Administered 2013-10-19: 50 mg via INTRAVENOUS

## 2013-10-19 MED ORDER — DIPHENHYDRAMINE HCL 50 MG/ML IJ SOLN: 12.5000 mg | Freq: Four times a day (QID) | INTRAMUSCULAR | Status: DC | PRN

## 2013-10-19 MED ORDER — ONDANSETRON HCL 4 MG/2ML IJ SOLN
INTRAMUSCULAR | Status: AC
  Filled 2013-10-19: qty 2

## 2013-10-19 MED ORDER — INFLUENZA VAC SPLIT QUAD 0.5 ML IM SUSP
0.5000 mL | INTRAMUSCULAR | Status: DC
  Filled 2013-10-19 (×2): qty 0.5

## 2013-10-19 MED ORDER — 0.9 % SODIUM CHLORIDE (POUR BTL) OPTIME
TOPICAL | Status: DC | PRN
  Administered 2013-10-19: 1000 mL

## 2013-10-19 MED ORDER — ROCURONIUM BROMIDE 100 MG/10ML IV SOLN
INTRAVENOUS | Status: AC
  Filled 2013-10-19: qty 1

## 2013-10-19 MED ORDER — ACETAMINOPHEN 650 MG RE SUPP: 650.0000 mg | Freq: Four times a day (QID) | RECTAL | Status: DC | PRN

## 2013-10-19 MED ORDER — DIPHENHYDRAMINE HCL 12.5 MG/5ML PO ELIX: 12.5000 mg | ORAL_SOLUTION | Freq: Four times a day (QID) | ORAL | Status: DC | PRN

## 2013-10-19 MED ORDER — INSULIN ASPART 100 UNIT/ML ~~LOC~~ SOLN: 0.0000 [IU] | Freq: Every day | SUBCUTANEOUS | Status: DC

## 2013-10-19 MED ORDER — FENTANYL CITRATE 0.05 MG/ML IJ SOLN: INTRAMUSCULAR | Status: DC | PRN

## 2013-10-19 MED ORDER — DEXAMETHASONE SODIUM PHOSPHATE 10 MG/ML IJ SOLN
INTRAMUSCULAR | Status: DC | PRN
  Administered 2013-10-19: 10 mg via INTRAVENOUS

## 2013-10-19 SURGICAL SUPPLY — 55 items
APPLIER CLIP 5 13 M/L LIGAMAX5 (MISCELLANEOUS) ×3
APPLIER CLIP ROT 10 11.4 M/L (STAPLE)
BENZOIN TINCTURE PRP APPL 2/3 (GAUZE/BANDAGES/DRESSINGS) ×3 IMPLANT
BLADE HEX COATED 2.75 (ELECTRODE) ×3 IMPLANT
BLADE SURG SZ10 CARB STEEL (BLADE) ×6 IMPLANT
CANISTER SUCTION 2500CC (MISCELLANEOUS) ×3 IMPLANT
CHLORAPREP W/TINT 26ML (MISCELLANEOUS) ×3 IMPLANT
CLIP APPLIE 5 13 M/L LIGAMAX5 (MISCELLANEOUS) ×2 IMPLANT
CLIP APPLIE ROT 10 11.4 M/L (STAPLE) IMPLANT
COVER MAYO STAND STRL (DRAPES) IMPLANT
COVER SURGICAL LIGHT HANDLE (MISCELLANEOUS) ×3 IMPLANT
DECANTER SPIKE VIAL GLASS SM (MISCELLANEOUS) IMPLANT
DRAPE C-ARM 42X120 X-RAY (DRAPES) IMPLANT
DRAPE INCISE IOBAN 66X45 STRL (DRAPES) ×3 IMPLANT
DRAPE LAPAROSCOPIC ABDOMINAL (DRAPES) ×3 IMPLANT
DRAPE LAPAROTOMY T 102X78X121 (DRAPES) ×3 IMPLANT
DRAPE UTILITY XL STRL (DRAPES) ×3 IMPLANT
DRSG TEGADERM 2-3/8X2-3/4 SM (GAUZE/BANDAGES/DRESSINGS) ×12 IMPLANT
DRSG TEGADERM 4X4.75 (GAUZE/BANDAGES/DRESSINGS) ×3 IMPLANT
ELECT REM PT RETURN 9FT ADLT (ELECTROSURGICAL) ×3
ELECTRODE REM PT RTRN 9FT ADLT (ELECTROSURGICAL) ×2 IMPLANT
GLOVE BIOGEL PI IND STRL 7.0 (GLOVE) ×2 IMPLANT
GLOVE BIOGEL PI INDICATOR 7.0 (GLOVE) ×1
GLOVE ECLIPSE 8.0 STRL XLNG CF (GLOVE) ×3 IMPLANT
GLOVE INDICATOR 8.0 STRL GRN (GLOVE) ×6 IMPLANT
GOWN PREVENTION PLUS LG XLONG (DISPOSABLE) ×3 IMPLANT
GOWN STRL REIN XL XLG (GOWN DISPOSABLE) ×6 IMPLANT
HEMOSTAT SURGICEL 4X8 (HEMOSTASIS) IMPLANT
IV CATH 14GX2 1/4 (CATHETERS) IMPLANT
KIT BASIN OR (CUSTOM PROCEDURE TRAY) ×3 IMPLANT
NEEDLE HYPO 22GX1.5 SAFETY (NEEDLE) ×3 IMPLANT
NS IRRIG 1000ML POUR BTL (IV SOLUTION) ×3 IMPLANT
PACK BASIC VI WITH GOWN DISP (CUSTOM PROCEDURE TRAY) ×3 IMPLANT
PENCIL BUTTON HOLSTER BLD 10FT (ELECTRODE) ×3 IMPLANT
POUCH SPECIMEN RETRIEVAL 10MM (ENDOMECHANICALS) ×3 IMPLANT
SET CHOLANGIOGRAPH MIX (MISCELLANEOUS) ×3 IMPLANT
SET IRRIG TUBING LAPAROSCOPIC (IRRIGATION / IRRIGATOR) ×3 IMPLANT
SOL PREP POV-IOD 16OZ 10% (MISCELLANEOUS) ×3 IMPLANT
SOLUTION ANTI FOG 6CC (MISCELLANEOUS) ×3 IMPLANT
SPONGE GAUZE 4X4 12PLY (GAUZE/BANDAGES/DRESSINGS) ×3 IMPLANT
SPONGE LAP 4X18 X RAY DECT (DISPOSABLE) ×3 IMPLANT
STRIP CLOSURE SKIN 1/2X4 (GAUZE/BANDAGES/DRESSINGS) ×3 IMPLANT
SUT MNCRL AB 4-0 PS2 18 (SUTURE) ×3 IMPLANT
SUT SURG 0 T 19/GS 22 1969 62 (SUTURE) ×3 IMPLANT
SUT VIC AB 3-0 SH 27 (SUTURE) ×1
SUT VIC AB 3-0 SH 27XBRD (SUTURE) ×2 IMPLANT
SYR BULB IRRIGATION 50ML (SYRINGE) ×3 IMPLANT
SYR CONTROL 10ML LL (SYRINGE) ×3 IMPLANT
TOWEL OR 17X26 10 PK STRL BLUE (TOWEL DISPOSABLE) ×3 IMPLANT
TRAY LAP CHOLE (CUSTOM PROCEDURE TRAY) ×3 IMPLANT
TROCAR BLADELESS OPT 5 75 (ENDOMECHANICALS) ×9 IMPLANT
TROCAR XCEL BLUNT TIP 100MML (ENDOMECHANICALS) ×3 IMPLANT
TROCAR XCEL NON-BLD 11X100MML (ENDOMECHANICALS) IMPLANT
TUBING INSUFFLATION 10FT LAP (TUBING) ×3 IMPLANT
YANKAUER SUCT BULB TIP 10FT TU (MISCELLANEOUS) ×3 IMPLANT

## 2013-10-19 NOTE — Anesthesia Preprocedure Evaluation (Signed)
Anesthesia Evaluation  Patient identified by MRN, date of birth, ID band Patient awake    Reviewed: Allergy & Precautions, H&P , NPO status , Patient's Chart, lab work & pertinent test results  Airway Mallampati: II TM Distance: >3 FB Neck ROM: Full    Dental no notable dental hx.    Pulmonary neg pulmonary ROS,  breath sounds clear to auscultation  Pulmonary exam normal       Cardiovascular negative cardio ROS  Rhythm:Regular Rate:Normal     Neuro/Psych negative neurological ROS  negative psych ROS   GI/Hepatic negative GI ROS, Neg liver ROS,   Endo/Other  diabetes, Type 2, Oral Hypoglycemic Agents  Renal/GU negative Renal ROS  negative genitourinary   Musculoskeletal negative musculoskeletal ROS (+)   Abdominal   Peds negative pediatric ROS (+)  Hematology negative hematology ROS (+)   Anesthesia Other Findings   Reproductive/Obstetrics negative OB ROS                           Anesthesia Physical Anesthesia Plan  ASA: II  Anesthesia Plan: General   Post-op Pain Management:    Induction: Intravenous  Airway Management Planned: Oral ETT  Additional Equipment:   Intra-op Plan:   Post-operative Plan: Extubation in OR  Informed Consent: I have reviewed the patients History and Physical, chart, labs and discussed the procedure including the risks, benefits and alternatives for the proposed anesthesia with the patient or authorized representative who has indicated his/her understanding and acceptance.   Dental advisory given  Plan Discussed with: CRNA and Surgeon  Anesthesia Plan Comments:         Anesthesia Quick Evaluation

## 2013-10-19 NOTE — Progress Notes (Signed)
Subjective: She feels better, but still has pain in the RUQ with palpation.    Objective: Vital signs in last 24 hours: Temp:  [98 F (36.7 C)-98.6 F (37 C)] 98 F (36.7 C) (11/21 0545) Pulse Rate:  [76-92] 76 (11/21 0545) Resp:  [15-20] 18 (11/21 0545) BP: (102-139)/(61-96) 110/71 mmHg (11/21 0545) SpO2:  [97 %-100 %] 100 % (11/21 0545) Weight:  [79.5 kg (175 lb 4.3 oz)-80.74 kg (178 lb)] 79.5 kg (175 lb 4.3 oz) (11/21 0245) Last BM Date: 10/18/13 Afebrile VSS Labs OK CT chest: bilateral thyroid densities/No PE CT abd/pelvis: borderline GB thickening. Fluid left ovary margin/small umbilical hernia/L5-S1 ridging possible foraminal stenosis Intake/Output from previous day: 11/20 0701 - 11/21 0700 In: 275 [I.V.:175; IV Piggyback:100] Out: -  Intake/Output this shift:    General appearance: alert, cooperative and no distress GI: soft, some tenderness LUQ, much more tender RUQ. no distension + BS Small umbilical hernia Lab Results:   Recent Labs  10/18/13 1750 10/19/13 0500  WBC 11.6* 10.0  HGB 13.5 12.5  HCT 40.3 36.8  PLT 338 329    BMET  Recent Labs  10/18/13 1750  NA 137  K 3.7  CL 99  CO2 24  GLUCOSE 126*  BUN 10  CREATININE 0.50  CALCIUM 9.5   PT/INR No results found for this basename: LABPROT, INR,  in the last 72 hours   Recent Labs Lab 10/18/13 1750 10/19/13 0500  AST 14 10  ALT 19 13  ALKPHOS 54 45  BILITOT 0.1* 0.2*  PROT 7.8 6.5  ALBUMIN 4.1 3.3*     Lipase     Component Value Date/Time   LIPASE 38 10/18/2013 1750     Studies/Results: Ct Angio Chest Pe W/cm &/or Wo Cm  10/18/2013   CLINICAL DATA:  Epigastric pain. Pleuritic left flank pain. Abdominal pain.  EXAM: CT ANGIOGRAPHY CHEST  CT ABDOMEN AND PELVIS WITH CONTRAST  TECHNIQUE: Multidetector CT imaging of the chest was performed using the standard protocol during bolus administration of intravenous contrast. Multiplanar CT image reconstructions including MIPs were  obtained to evaluate the vascular anatomy. Multidetector CT imaging of the abdomen and pelvis was performed using the standard protocol during bolus administration of intravenous contrast.  CONTRAST:  50mL OMNIPAQUE IOHEXOL 300 MG/ML SOLN, OMNIPAQUE IOHEXOL 350 MG/ML SOLN  COMPARISON:  10/18/2013 abdominal ultrasound.  FINDINGS: CTA CHEST FINDINGS  Low-density prominence in the left thyroid lobe measures 1.7 cm and in the right thyroid lobe measures 1.8 cm.  No filling defect is identified in the pulmonary arterial tree to suggest pulmonary embolus. No acute aortic findings. No pericardial effusion. No pathologic thoracic adenopathy.  The lungs appear clear.  No pleural effusion.  CT ABDOMEN and PELVIS FINDINGS  The liver, spleen, pancreas, and adrenal glands appear unremarkable. Borderline gallbladder wall thickening. Kidneys and proximal ureters unremarkable. No pathologic upper abdominal adenopathy is observed. Small umbilical hernia contains adipose tissue. Appendix normal.  2.5 cm fluid density lesion along the left ovary margin. Urinary bladder mildly distended but otherwise unremarkable.  Posterior osseous ridging noted at L5-S1, with foraminal components contributing to mild foraminal stenosis.  Review of the MIP images confirms the above findings.  IMPRESSION: 1. Bilateral low-density thyroid lesions. Consider further evaluation with thyroid ultrasound. If patient is clinically hyperthyroid, consider nuclear medicine thyroid uptake and scan. 2. Borderline gallbladder wall thickening. 3. 2.5 cm fluid density lesion of the left ovary. Based on size criteria, this does not require followup. This recommendation follows ACR  consensus guidelines: White Paper of the ACR Incidental Findings Committee II on Adnexal Findings. J Am Coll Radiol [phone]. 4. Posterior osseous ridging at L5-S1, likely causing mild bilateral foraminal stenosis. 5. Small umbilical hernia contains adipose tissue.    Electronically Signed   By: Herbie Baltimore M.D.   On: 10/18/2013 22:00   US Abdomen Complete  10/18/2013   CLINICAL DATA:  Right upper quadrant/epigastric pain. History of diabetes.  EXAM: ULTRASOUND ABDOMEN COMPLETE  COMPARISON:  None.  FINDINGS: Gallbladder  numerous gallstones with posterior shadowing, each measuring up to about 1.6 cm in diameter. Borderline gallbladder wall thickening at 3 mm. No pericholecystic fluid. Sonographic Murphy's sign: Absent.  Common bile duct  Diameter: 4 mm, within normal limits.  Liver  No focal lesion identified. Within normal limits in parenchymal echogenicity.  IVC  Limited visualization due to overlying bowel gas.  .  Pancreas  Limited visualization due to overlying bowel gas; visualized portion of pancreatic head and body unremarkable. Marland Kitchen  Spleen  Size and appearance within normal limits.  Right Kidney  Length: 13.9 cm. Echogenicity within normal limits. No mass or hydronephrosis visualized.  Left Kidney  Length: 13.0 cm. Echogenicity within normal limits. No mass or hydronephrosis visualized.  Abdominal aorta  No aneurysm visualized.  IMPRESSION: 1. Cholelithiasis, with borderline gallbladder wall thickening. No pericholecystic fluid or sonographic Murphy sign. Correlate clinically in assessing for acute cholecystitis. 2. Overlying bowel gas causes limited visualization of the IVC and pancreas.   Electronically Signed   By: Herbie Baltimore M.D.   On: 10/18/2013 18:55   Ct Abdomen Pelvis W Contrast  10/18/2013   CLINICAL DATA:  Epigastric pain. Pleuritic left flank pain. Abdominal pain.  EXAM: CT ANGIOGRAPHY CHEST  CT ABDOMEN AND PELVIS WITH CONTRAST  TECHNIQUE: Multidetector CT imaging of the chest was performed using the standard protocol during bolus administration of intravenous contrast. Multiplanar CT image reconstructions including MIPs were obtained to evaluate the vascular anatomy. Multidetector CT imaging of the abdomen and pelvis was performed using the  standard protocol during bolus administration of intravenous contrast.  CONTRAST:  50mL OMNIPAQUE IOHEXOL 300 MG/ML SOLN, OMNIPAQUE IOHEXOL 350 MG/ML SOLN  COMPARISON:  10/18/2013 abdominal ultrasound.  FINDINGS: CTA CHEST FINDINGS  Low-density prominence in the left thyroid lobe measures 1.7 cm and in the right thyroid lobe measures 1.8 cm.  No filling defect is identified in the pulmonary arterial tree to suggest pulmonary embolus. No acute aortic findings. No pericardial effusion. No pathologic thoracic adenopathy.  The lungs appear clear.  No pleural effusion.  CT ABDOMEN and PELVIS FINDINGS  The liver, spleen, pancreas, and adrenal glands appear unremarkable. Borderline gallbladder wall thickening. Kidneys and proximal ureters unremarkable. No pathologic upper abdominal adenopathy is observed. Small umbilical hernia contains adipose tissue. Appendix normal.  2.5 cm fluid density lesion along the left ovary margin. Urinary bladder mildly distended but otherwise unremarkable.  Posterior osseous ridging noted at L5-S1, with foraminal components contributing to mild foraminal stenosis.  Review of the MIP images confirms the above findings.  IMPRESSION: 1. Bilateral low-density thyroid lesions. Consider further evaluation with thyroid ultrasound. If patient is clinically hyperthyroid, consider nuclear medicine thyroid uptake and scan. 2. Borderline gallbladder wall thickening. 3. 2.5 cm fluid density lesion of the left ovary. Based on size criteria, this does not require followup. This recommendation follows ACR consensus guidelines: White Paper of the ACR Incidental Findings Committee II on Adnexal Findings. J Am Coll Radiol [phone]. 4. Posterior osseous ridging at L5-S1, likely  causing mild bilateral foraminal stenosis. 5. Small umbilical hernia contains adipose tissue.   Electronically Signed   By: Herbie Baltimore M.D.   On: 10/18/2013 22:00    Medications: . ampicillin-sulbactam (UNASYN) IV  3  g Intravenous Q6H  . Chlorhexidine Gluconate Cloth  6 each Topical Q0600  . heparin  5,000 Units Subcutaneous Q8H  . [START ON 10/20/2013] influenza vac split quadrivalent PF  0.5 mL Intramuscular Tomorrow-1000  . insulin aspart  0-15 Units Subcutaneous TID WC  . insulin aspart  0-5 Units Subcutaneous QHS  . lip balm  1 application Topical BID  . psyllium  1 packet Oral BID  . saccharomyces boulardii  250 mg Oral BID    Assessment/Plan Cholelithiasis with cholecystitis AODM   Plan:  Dr Abbey Chatters will see and discuss surgery for later this AM.    LOS: 1 day    Dulse Rutan 10/19/2013

## 2013-10-19 NOTE — ED Notes (Signed)
EDP [place] at bedside

## 2013-10-19 NOTE — Transfer of Care (Signed)
Immediate Anesthesia Transfer of Care Note  Patient: Angel Bradshaw  Procedure(s) Performed: Procedure(s): LAPAROSCOPIC CHOLECYSTECTOMY (N/A)  Patient Location: PACU  Anesthesia Type:General  Level of Consciousness: awake and alert   Airway & Oxygen Therapy: Patient Spontanous Breathing and Patient connected to nasal cannula oxygen  Post-op Assessment: Report given to PACU RN and Post -op Vital signs reviewed and stable  Post vital signs: Reviewed and stable  Complications: No apparent anesthesia complications

## 2013-10-19 NOTE — Op Note (Signed)
Preoperative diagnosis:  Cholelithiasis with chronic cholecystitis  Postoperative diagnosis:  Same  Procedure: Laparoscopic cholecystectomy with cholangiogram.  Surgeon: Avel Peace, M.D.  Asst.:  Britt Bottom, [place] student  Anesthesia: General  Indication:   This is a 47 year old female transferred to our facility because of persistent biliary colic. She has gallstones on ultrasound. Despite pain medication her pain comes back. She had a mild elevation of her white blood cell count and there is concern of the ball and acute cholecystitis. She is brought to the operating room for the above procedure.  Technique: She was brought to the operating room, placed supine on the operating table, and a general anesthetic was administered. The abdominal wall was then sterilely prepped and draped. Local anesthetic (Marcaine) was infiltrated in the subumbilical region. A small subumbilical incision was made through the skin, subcutaneous tissue, fascia, and peritoneum entering the peritoneal cavity under direct vision. A pursestring suture of 0 Vicryl was placed around the edges of the fascia. A Hassan trocar was introduced into the peritoneal cavity and a pneumoperitoneum was created by insufflation of carbon dioxide gas. The laparoscope was introduced into the trocar and no underlying bleeding or organ injury was noted. The patient was then placed in the reverse Trendelenburg position with the right side tilted slightly up.  Three 5 mm trocars were then placed into the abdominal cavity under laparoscopic vision. One in the epigastric area, and 2 in the right upper quadrant area. The gallbladder was visualized and the fundus was grasped and retracted toward the right shoulder. There is no evidence of acute inflammation. The infundibulum was mobilized with dissection close to the gallbladder and retracted laterally. The cystic duct was identified and a window was created around it. The cystic artery was also  identified and a window was created around it. The critical view was achieved. A clip was placed at the neck of the gallbladder. A small incision was made in the cystic duct. A cholangiocatheter was introduced through the anterior abdominal wall and placed in the cystic duct. A intraoperative cholangiogram was then performed.  Under real-time fluoroscopy, dilute contrast was injected into the cystic duct.  The common hepatic duct, the right and left hepatic ducts, and the common duct were all visualized. Contrast drained into the duodenum without obvious evidence of any obstructing ductal lesion. The final report is pending the Radiologist's interpretation.  The cholangiocatheter was removed, the cystic duct was clipped 3 times on the biliary side, and then the cystic duct was divided sharply. No bile leak was noted from the cystic duct stump.  The cystic artery was then clipped and divided. Following this the gallbladder was dissected free from the liver using electrocautery.  There was one small structure that appeared to be an accessory bile duct that was clipped. The gallbladder was then placed in a retrieval bag and removed from the abdominal cavity through the subumbilical incision.  The gallbladder fossa was inspected, irrigated, and bleeding was controlled with electrocautery. Inspection showed that hemostasis was adequate and there was no evidence of bile leak.  The irrigation fluid was evacuated as much as possible.  The subumbilical trocar was removed and the fascial defect was closed by tightening and tying down the pursestring suture under laparoscopic vision.  The remaining trocars were removed and the pneumoperitoneum was released. The skin incisions were closed with 4-0 Monocryl subcuticular stitches. Steri-Strips and sterile dressings were applied.  The procedure was well-tolerated without any apparent complications. She was taken to the recovery  room in satisfactory condition.

## 2013-10-19 NOTE — ED Notes (Signed)
Dr Gross at bedside

## 2013-10-19 NOTE — ED Provider Notes (Signed)
Patient is transferred from [facility] ED for cholelithiasis. She's been having upper quadrant abdominal pain for the past 3 days starting after lunch. She states her pain is currently improved so can be exacerbated with palpation. She had one episode of vomiting but is currently not nauseated. Work up and he pinches she has elevated white blood cell count and multiple gallstones in her gallbladder without any definite evidence of acute cholecystitis. Dr. Manus Gunning discuss the patient with Dr. Michaell Cowing the general surgeon on call he requested her transfer to the emergency department so he can evaluate her. Will consult Dr. Michaell Cowing and inform him that he patient is in the emergency department currently.  Patient is in no acute distress. Her vital signs are stable. Her abdomen is soft but tender to palpation in the right upper quadrant. She has no rebound or guarding. Heart regular rate and rhythm. Lungs are clear.  Dr. Michaell Cowing to admit the patient.  Loren Racer, MD 10/19/13 [phone]

## 2013-10-19 NOTE — H&P (Signed)
Angel Bradshaw  1965-12-01 [id number]  CARE TEAM:  PCP: Deneen Harts, FNP  Outpatient Care Team: Patient Care Team: Deneen Harts, FNP as PCP - General (Nurse Practitioner)  Inpatient Treatment Team: Treatment Team: Attending Provider: Loren Racer, MD; Registered Nurse: Alfredo Martinez, RN; Technician: Heloise Ochoa, EMT; Consulting Physician: Bishop Limbo, MD  This patient is a 47 y.o.female who presents today for surgical evaluation at the request of Dr. Manus Gunning, [facility] ED.   Reason for evaluation:  Abd pain, probable cholecystitis  Pleasant obese diabetic female.  Hispanic origin.  Bilingual.  Husband is with her.  He speaks Spanish easily and most English relatively well.  She has a history of mild reflux in the past.  She has had some episodes of intermittent abdominal pain nausea and bloating.  Usually a bit mild.  The symptoms are different than her heartburn.  However, she started having more intense symptoms 3 days ago.  Became more constant.  The pain seemed to be more in the left upper abdomen.  Her husband was concerned and brought her to the [facility] emergency room.  Patient underwent extensive workup.  PE and myocardial infarction ruled out some symptoms and be more up in the chest first.  Ultrasound and CAT scan shows mildly thickened gallbladder wall with gallstones.  Cholecystitis suspected.  Urinalysis suspicious for urinary tract infection.  Patient denies any dysuria pyuria or hematuria at this time.  Because the gallbladder seemed to be the most likely symptom, surgical consultation requested.  Patient transferred to [facility] emergency department so that surgery consult could be made.   No exertional chest/neck/shoulder/arm pain.  Patient can walk 30 minutes for about 1 miles without difficulty.  No personal nor family history of GI/colon cancer, inflammatory bowel disease, irritable bowel syndrome, allergy such as Celiac Sprue, dietary/dairy problems, colitis, ulcers nor  gastritis.  No recent sick contacts/gastroenteritis.  No travel outside the country.  No changes in diet.  She denies any alcohol use.  No history of pancreatitis.  No history of jaundice.  No dark colored urine or light/clay-colored stools.  No history of hepatitis.  No history of fall or trauma.  She had a tubal ligation done at her belly button but no other abdominal surgeries.  She does occasionally fill pulling a discomfort at her bellybutton, especially at the left side.     Past Medical History  Diagnosis Date  . Diabetes mellitus     Past Surgical History  Procedure Laterality Date  . Tubal ligation      History   Social History  . Marital Status: Married    Spouse Name: N/A    Number of Children: N/A  . Years of Education: N/A   Occupational History  . Not on file.   Social History Main Topics  . Smoking status: Never Smoker   . Smokeless tobacco: Never Used  . Alcohol Use: No  . Drug Use: No  . Sexual Activity: Yes    Birth Control/ Protection: Surgical   Other Topics Concern  . Not on file   Social History Narrative  . No narrative on file    Family History  Problem Relation Age of Onset  . Diabetes Mother     Current Facility-Administered Medications  Medication Dose Route Frequency Provider Last Rate Last Dose  . cefTRIAXone (ROCEPHIN) 1 G injection            Current Outpatient Prescriptions  Medication Sig Dispense Refill  . aspirin EC 81 MG tablet  Take 81 mg by mouth daily.      . Canagliflozin (INVOKANA) 100 MG TABS Take 100 mg by mouth daily.       . fish oil-omega-3 fatty acids 1000 MG capsule Take 1 g by mouth daily.      . metFORMIN (GLUCOPHAGE) 1000 MG tablet Take 1,000 mg by mouth 2 (two) times daily with a meal.      . sitaGLIPtin (JANUVIA) 100 MG tablet Take 100 mg by mouth daily.         No Known Allergies  ROS: Constitutional:  No fevers, chills, sweats.  Weight stable Eyes:  No vision changes, No discharge HENT:  No sore  throats, nasal drainage Lymph: No neck swelling, No bruising easily Pulmonary:  No cough, productive sputum CV: No orthopnea, PND  Patient walks 30 minutes for about 1 miles without difficulty.  No exertional chest/neck/shoulder/arm pain. GI: No personal nor family history of GI/colon cancer, inflammatory bowel disease, irritable bowel syndrome, allergy such as Celiac Sprue, dietary/dairy problems, colitis, ulcers nor gastritis.  No recent sick contacts/gastroenteritis.  No travel outside the country.  No changes in diet. Renal: No UTIs, No hematuria Genital:  No drainage, bleeding, masses Musculoskeletal: No severe joint pain.  Good ROM major joints Skin:  No sores or lesions.  No rashes Heme/Lymph:  No easy bleeding.  No swollen lymph nodes Neuro: No focal weakness/numbness.  No seizures Psych: No suicidal ideation.  No hallucinations  BP 102/61  Pulse 80  Temp(Src) 98.6 F (37 C) (Oral)  Resp 15  Wt 178 lb (80.74 kg)  SpO2 98%  LMP 10/02/2013  Physical Exam: General: Pt awake/alert/oriented x4 in no major acute distress.  Husband at bedside Eyes: PERRL, normal EOM. Sclera nonicteric Neuro: CN II-XII intact w/o focal sensory/motor deficits. Lymph: No head/neck/groin lymphadenopathy Psych:  No delerium/psychosis/paranoia HENT: Normocephalic, Mucus membranes moist.  No thrush Neck: Supple, No tracheal deviation Chest: No pain.  Good respiratory excursion. CV:  Pulses intact.  Regular rhythm Abdomen: Soft, Nondistended.  Mild RUQ TTP - weak Murphy's sign.  Umbilical incision.  Sensitive at bellybutton - probable small hernia.  Otherwise abdomen is nontender.  Ext:  SCDs BLE.  No significant edema.  No cyanosis Skin: No petechiae / purpurea.  No major sores Musculoskeletal: No severe joint pain.  Good ROM major joints   Results:   Labs: Results for orders placed during the hospital encounter of 10/18/13 (from the past 48 hour(s))  URINALYSIS, ROUTINE W REFLEX MICROSCOPIC      Status: Abnormal   Collection Time    10/18/13  5:49 PM      Result Value Range   Color, Urine YELLOW  YELLOW   APPearance CLOUDY (*) CLEAR   Specific Gravity, Urine 1.007  1.005 - 1.030   pH 7.0  5.0 - 8.0   Glucose, UA >1000 (*) NEGATIVE mg/dL   Hgb urine dipstick TRACE (*) NEGATIVE   Bilirubin Urine NEGATIVE  NEGATIVE   Ketones, ur NEGATIVE  NEGATIVE mg/dL   Protein, ur NEGATIVE  NEGATIVE mg/dL   Urobilinogen, UA 0.2  0.0 - 1.0 mg/dL   Nitrite NEGATIVE  NEGATIVE   Leukocytes, UA MODERATE (*) NEGATIVE  PREGNANCY, URINE     Status: None   Collection Time    10/18/13  5:49 PM      Result Value Range   Preg Test, Ur NEGATIVE  NEGATIVE   Comment:            THE SENSITIVITY OF THIS  METHODOLOGY IS >20 mIU/mL.  URINE MICROSCOPIC-ADD ON     Status: None   Collection Time    10/18/13  5:49 PM      Result Value Range   Squamous Epithelial / LPF RARE  RARE   WBC, UA 7-10  <3 WBC/hpf   RBC / HPF 3-6  <3 RBC/hpf   Bacteria, UA RARE  RARE   Urine-Other MUCOUS PRESENT    CBC WITH DIFFERENTIAL     Status: Abnormal   Collection Time    10/18/13  5:50 PM      Result Value Range   WBC 11.6 (*) 4.0 - 10.5 K/uL   RBC 4.96  3.87 - 5.11 MIL/uL   Hemoglobin 13.5  12.0 - 15.0 g/dL   HCT 60.4  54.0 - 98.1 %   MCV 81.3  78.0 - 100.0 fL   MCH 27.2  26.0 - 34.0 pg   MCHC 33.5  30.0 - 36.0 g/dL   RDW 19.1  47.8 - 29.5 %   Platelets 338  150 - 400 K/uL   Neutrophils Relative % 69  43 - 77 %   Neutro Abs 7.9 (*) 1.7 - 7.7 K/uL   Lymphocytes Relative 25  12 - 46 %   Lymphs Abs 2.9  0.7 - 4.0 K/uL   Monocytes Relative 6  3 - 12 %   Monocytes Absolute 0.7  0.1 - 1.0 K/uL   Eosinophils Relative 1  0 - 5 %   Eosinophils Absolute 0.1  0.0 - 0.7 K/uL   Basophils Relative 0  0 - 1 %   Basophils Absolute 0.0  0.0 - 0.1 K/uL  COMPREHENSIVE METABOLIC PANEL     Status: Abnormal   Collection Time    10/18/13  5:50 PM      Result Value Range   Sodium 137  135 - 145 mEq/L   Potassium 3.7  3.5 -  5.1 mEq/L   Chloride 99  96 - 112 mEq/L   CO2 24  19 - 32 mEq/L   Glucose, Bld 126 (*) 70 - 99 mg/dL   BUN 10  6 - 23 mg/dL   Creatinine, Ser 6.21  0.50 - 1.10 mg/dL   Calcium 9.5  8.4 - 30.8 mg/dL   Total Protein 7.8  6.0 - 8.3 g/dL   Albumin 4.1  3.5 - 5.2 g/dL   AST 14  0 - 37 U/L   ALT 19  0 - 35 U/L   Alkaline Phosphatase 54  39 - 117 U/L   Total Bilirubin 0.1 (*) 0.3 - 1.2 mg/dL   GFR calc non Af Amer >90  >90 mL/min   GFR calc Af Amer >90  >90 mL/min   Comment: (NOTE)     The eGFR has been calculated using the CKD EPI equation.     This calculation has not been validated in all clinical situations.     eGFR's persistently <90 mL/min signify possible Chronic Kidney     Disease.  LIPASE, BLOOD     Status: None   Collection Time    10/18/13  5:50 PM      Result Value Range   Lipase 38  11 - 59 U/L  KETONES, QUALITATIVE     Status: None   Collection Time    10/18/13  5:50 PM      Result Value Range   Acetone, Bld NEGATIVE  NEGATIVE  TROPONIN I     Status: None   Collection Time  10/18/13  5:50 PM      Result Value Range   Troponin I <0.30  <0.30 ng/mL   Comment:            Due to the release kinetics of cTnI,     a negative result within the first hours     of the onset of symptoms does not rule out     myocardial infarction with certainty.     If myocardial infarction is still suspected,     repeat the test at appropriate intervals.  POCT I-STAT 3, BLOOD GAS (G3P V)     Status: Abnormal   Collection Time    10/18/13  6:09 PM      Result Value Range   pH, Ven 7.429 (*) 7.250 - 7.300   pCO2, Ven 37.4 (*) 45.0 - 50.0 mmHg   pO2, Ven 45.0  30.0 - 45.0 mmHg   Bicarbonate 24.8 (*) 20.0 - 24.0 mEq/L   TCO2 26  0 - 100 mmol/L   O2 Saturation 82.0     Acid-Base Excess 1.0  0.0 - 2.0 mmol/L   Patient temperature 98.6 F     Collection site IV START     Drawn by Operator     Sample type VENOUS    GLUCOSE, CAPILLARY     Status: Abnormal   Collection Time     10/18/13  6:09 PM      Result Value Range   Glucose-Capillary 118 (*) 70 - 99 mg/dL   Comment 1 Notify RN     Comment 2 Documented in Chart    CG4 I-STAT (LACTIC ACID)     Status: None   Collection Time    10/18/13  6:10 PM      Result Value Range   Lactic Acid, Venous 1.10  0.5 - 2.2 mmol/L  GLUCOSE, CAPILLARY     Status: Abnormal   Collection Time    10/18/13 10:41 PM      Result Value Range   Glucose-Capillary 103 (*) 70 - 99 mg/dL    Imaging / Studies: Ct Angio Chest Pe W/cm &/or Wo Cm  10/18/2013   CLINICAL DATA:  Epigastric pain. Pleuritic left flank pain. Abdominal pain.  EXAM: CT ANGIOGRAPHY CHEST  CT ABDOMEN AND PELVIS WITH CONTRAST  TECHNIQUE: Multidetector CT imaging of the chest was performed using the standard protocol during bolus administration of intravenous contrast. Multiplanar CT image reconstructions including MIPs were obtained to evaluate the vascular anatomy. Multidetector CT imaging of the abdomen and pelvis was performed using the standard protocol during bolus administration of intravenous contrast.  CONTRAST:  50mL OMNIPAQUE IOHEXOL 300 MG/ML SOLN, OMNIPAQUE IOHEXOL 350 MG/ML SOLN  COMPARISON:  10/18/2013 abdominal ultrasound.  FINDINGS: CTA CHEST FINDINGS  Low-density prominence in the left thyroid lobe measures 1.7 cm and in the right thyroid lobe measures 1.8 cm.  No filling defect is identified in the pulmonary arterial tree to suggest pulmonary embolus. No acute aortic findings. No pericardial effusion. No pathologic thoracic adenopathy.  The lungs appear clear.  No pleural effusion.  CT ABDOMEN and PELVIS FINDINGS  The liver, spleen, pancreas, and adrenal glands appear unremarkable. Borderline gallbladder wall thickening. Kidneys and proximal ureters unremarkable. No pathologic upper abdominal adenopathy is observed. Small umbilical hernia contains adipose tissue. Appendix normal.  2.5 cm fluid density lesion along the left ovary margin. Urinary bladder  mildly distended but otherwise unremarkable.  Posterior osseous ridging noted at L5-S1, with foraminal components contributing to mild foraminal  stenosis.  Review of the MIP images confirms the above findings.  IMPRESSION: 1. Bilateral low-density thyroid lesions. Consider further evaluation with thyroid ultrasound. If patient is clinically hyperthyroid, consider nuclear medicine thyroid uptake and scan. 2. Borderline gallbladder wall thickening. 3. 2.5 cm fluid density lesion of the left ovary. Based on size criteria, this does not require followup. This recommendation follows ACR consensus guidelines: White Paper of the ACR Incidental Findings Committee II on Adnexal Findings. J Am Coll Radiol [phone]. 4. Posterior osseous ridging at L5-S1, likely causing mild bilateral foraminal stenosis. 5. Small umbilical hernia contains adipose tissue.   Electronically Signed   By: Herbie Baltimore M.D.   On: 10/18/2013 22:00   US Abdomen Complete  10/18/2013   CLINICAL DATA:  Right upper quadrant/epigastric pain. History of diabetes.  EXAM: ULTRASOUND ABDOMEN COMPLETE  COMPARISON:  None.  FINDINGS: Gallbladder  numerous gallstones with posterior shadowing, each measuring up to about 1.6 cm in diameter. Borderline gallbladder wall thickening at 3 mm. No pericholecystic fluid. Sonographic Murphy's sign: Absent.  Common bile duct  Diameter: 4 mm, within normal limits.  Liver  No focal lesion identified. Within normal limits in parenchymal echogenicity.  IVC  Limited visualization due to overlying bowel gas.  .  Pancreas  Limited visualization due to overlying bowel gas; visualized portion of pancreatic head and body unremarkable. Marland Kitchen  Spleen  Size and appearance within normal limits.  Right Kidney  Length: 13.9 cm. Echogenicity within normal limits. No mass or hydronephrosis visualized.  Left Kidney  Length: 13.0 cm. Echogenicity within normal limits. No mass or hydronephrosis visualized.  Abdominal aorta  No aneurysm  visualized.  IMPRESSION: 1. Cholelithiasis, with borderline gallbladder wall thickening. No pericholecystic fluid or sonographic Murphy sign. Correlate clinically in assessing for acute cholecystitis. 2. Overlying bowel gas causes limited visualization of the IVC and pancreas.   Electronically Signed   By: Herbie Baltimore M.D.   On: 10/18/2013 18:55   Ct Abdomen Pelvis W Contrast  10/18/2013   CLINICAL DATA:  Epigastric pain. Pleuritic left flank pain. Abdominal pain.  EXAM: CT ANGIOGRAPHY CHEST  CT ABDOMEN AND PELVIS WITH CONTRAST  TECHNIQUE: Multidetector CT imaging of the chest was performed using the standard protocol during bolus administration of intravenous contrast. Multiplanar CT image reconstructions including MIPs were obtained to evaluate the vascular anatomy. Multidetector CT imaging of the abdomen and pelvis was performed using the standard protocol during bolus administration of intravenous contrast.  CONTRAST:  50mL OMNIPAQUE IOHEXOL 300 MG/ML SOLN, OMNIPAQUE IOHEXOL 350 MG/ML SOLN  COMPARISON:  10/18/2013 abdominal ultrasound.  FINDINGS: CTA CHEST FINDINGS  Low-density prominence in the left thyroid lobe measures 1.7 cm and in the right thyroid lobe measures 1.8 cm.  No filling defect is identified in the pulmonary arterial tree to suggest pulmonary embolus. No acute aortic findings. No pericardial effusion. No pathologic thoracic adenopathy.  The lungs appear clear.  No pleural effusion.  CT ABDOMEN and PELVIS FINDINGS  The liver, spleen, pancreas, and adrenal glands appear unremarkable. Borderline gallbladder wall thickening. Kidneys and proximal ureters unremarkable. No pathologic upper abdominal adenopathy is observed. Small umbilical hernia contains adipose tissue. Appendix normal.  2.5 cm fluid density lesion along the left ovary margin. Urinary bladder mildly distended but otherwise unremarkable.  Posterior osseous ridging noted at L5-S1, with foraminal components contributing to  mild foraminal stenosis.  Review of the MIP images confirms the above findings.  IMPRESSION: 1. Bilateral low-density thyroid lesions. Consider further evaluation with thyroid ultrasound. If patient  is clinically hyperthyroid, consider nuclear medicine thyroid uptake and scan. 2. Borderline gallbladder wall thickening. 3. 2.5 cm fluid density lesion of the left ovary. Based on size criteria, this does not require followup. This recommendation follows ACR consensus guidelines: White Paper of the ACR Incidental Findings Committee II on Adnexal Findings. J Am Coll Radiol [phone]. 4. Posterior osseous ridging at L5-S1, likely causing mild bilateral foraminal stenosis. 5. Small umbilical hernia contains adipose tissue.   Electronically Signed   By: Herbie Baltimore M.D.   On: 10/18/2013 22:00    Medications / Allergies: per chart  Antibiotics: Anti-infectives   Start     Dose/Rate Route Frequency Ordered Stop   10/18/13 2230  piperacillin-tazobactam (ZOSYN) IVPB 3.375 g     3.375 g 12.5 mL/hr over 240 Minutes Intravenous  Once 10/18/13 2219 10/19/13 0020   10/18/13 1833  cefTRIAXone (ROCEPHIN) 1 G injection    Comments:  Fabiola Backer   : cabinet override      10/18/13 1833 10/19/13 0644   10/18/13 1830  cefTRIAXone (ROCEPHIN) 1 g in dextrose 5 % 50 mL IVPB     1 g 100 mL/hr over 30 Minutes Intravenous  Once 10/18/13 1822 10/18/13 1937      Assessment  Intisar Placide  47 y.o. female       Problem List:  Principal Problem:   Acute cholecystitis with chronic cholecystitis Active Problems:   UTI (urinary tract infection)   Umbilical hernia   Story of persistent biliary colic suspicious for cholecystitis.  Rest of workup negative.  Probable small incisional umbilical hernia.  Plan:  Admit  IV Unasyn antibiotics.  Followup on urine culture to see if the patient truly has urinary tract infection.  IV fluid resuscitation.  Sliding scale insulin for now.  Hold hypoglycemic  oral medications for now.  Laparoscopic cholecystectomy...:  The anatomy & physiology of hepatobiliary & pancreatic function was discussed.  The pathophysiology of gallbladder dysfunction was discussed.  Natural history risks without surgery was discussed.   I feel the risks of no intervention will lead to serious problems that outweigh the operative risks; therefore, I recommended cholecystectomy to remove the pathology.  I explained laparoscopic techniques with possible need for an open approach.  Probable cholangiogram to evaluate the bilary tract was explained as well.    Risks such as bleeding, infection, abscess, leak, injury to other organs, need for further treatment, heart attack, death, and other risks were discussed.  I noted a good likelihood this will help address the problem.  Possibility that this will not correct all abdominal symptoms was explained.  Goals of post-operative recovery were discussed as well.  We will work to minimize complications.    ...with probable primary repair of umbilical hernia at the same time  The anatomy & physiology of the abdominal wall was discussed.  The pathophysiology of hernias was discussed.  Natural history risks without surgery including progeressive enlargement, pain, incarceration & strangulation was discussed.   Contributors to complications such as smoking, obesity, diabetes, prior surgery, etc were discussed.   I feel the risks of no intervention will lead to serious problems that outweigh the operative risks; therefore, I recommended surgery to reduce and repair the hernia.  I explained laparoscopic techniques with possible need for an open approach.  I noted the probable use of mesh to patch and/or buttress the hernia repair  Risks such as bleeding, infection, abscess, need for further treatment, heart attack, death, and other risks were discussed.  I  noted a good likelihood this will help address the problem.   Goals of post-operative  recovery were discussed as well.  Possibility that this will not correct all symptoms was explained.  I stressed the importance of low-impact activity, aggressive pain control, avoiding constipation, & not pushing through pain to minimize risk of post-operative chronic pain or injury. Possibility of reherniation especially with smoking, obesity, diabetes, immunosuppression, and other health conditions was discussed.  We will work to minimize complications.     Questions were answered.  The patient & husband express understanding & wish to proceed with surgery.  -VTE prophylaxis- SCDs, etc  -mobilize as tolerated to help recovery    Ardeth Sportsman, M.D., F.A.C.S. Gastrointestinal and Minimally Invasive Surgery [facility] [address] [place], [place] [address] [phone] Main / Paging   10/19/2013

## 2013-10-19 NOTE — Progress Notes (Signed)
Patient seen and examined.  Plan laparoscopic cholecystectomy later today.  May leave umbilical hernia for a later date so mesh can be used and thus decrease the recurrence rate.

## 2013-10-20 MED ORDER — OXYCODONE HCL 5 MG PO TABS: 5.0000 mg | ORAL_TABLET | ORAL | Status: DC | PRN

## 2013-10-20 MED ORDER — IBUPROFEN 600 MG PO TABS: 600.0000 mg | ORAL_TABLET | Freq: Four times a day (QID) | ORAL | Status: DC | PRN

## 2013-10-20 MED ORDER — DOCUSATE SODIUM 100 MG PO CAPS: 100.0000 mg | ORAL_CAPSULE | Freq: Every day | ORAL | Status: DC

## 2013-10-20 NOTE — Progress Notes (Signed)
1 Day Post-Op  Subjective: Doing well.  Tolerating diet.  Ambulating.  Objective: Vital signs in last 24 hours: Temp:  [97.5 F (36.4 C)-99.8 F (37.7 C)] 98 F (36.7 C) (11/22 0615) Pulse Rate:  [67-99] 67 (11/22 0615) Resp:  [8-18] 18 (11/22 0615) BP: (99-136)/(63-86) 119/82 mmHg (11/22 0615) SpO2:  [98 %-100 %] 100 % (11/22 0615) Weight:  [179 lb 14.4 oz (81.602 kg)-180 lb 8.9 oz (81.9 kg)] 179 lb 14.4 oz (81.602 kg) (11/22 0700) Last BM Date: 10/18/13  Intake/Output from previous day: 11/21 0701 - 11/22 0700 In: 4345 [P.O.:720; I.V.:3625] Out: 3575 [Urine:3575] Intake/Output this shift:    General appearance: alert, cooperative and no distress GI: soft, sl distended.  wounds c/d/i Extremities: extremities normal, atraumatic, no cyanosis or edema  Lab Results:   Recent Labs  10/18/13 1750 10/19/13 0500  WBC 11.6* 10.0  HGB 13.5 12.5  HCT 40.3 36.8  PLT 338 329   BMET  Recent Labs  10/18/13 1750  NA 137  K 3.7  CL 99  CO2 24  GLUCOSE 126*  BUN 10  CREATININE 0.50  CALCIUM 9.5   PT/INR No results found for this basename: LABPROT, INR,  in the last 72 hours ABG  Recent Labs  10/18/13 1809  HCO3 24.8*    Studies/Results: Dg Cholangiogram Operative  10/19/2013   CLINICAL DATA:  Cholelithiasis  EXAM: INTRAOPERATIVE CHOLANGIOGRAM  TECHNIQUE: Cholangiographic images from the C-arm fluoroscopic device were submitted for interpretation post-operatively. Please see the procedural report for the amount of contrast and the fluoroscopy time utilized.  COMPARISON:  None.  FINDINGS: Gallbladder has been removed, and the cystic duct has been cannulated. Portions of the intrahepatic biliary ductal system are not visualized. The visualized portions of the intrahepatic biliary ductal system appear normal.  The common hepatic and common bile ducts appear normal. No mass or calculus seen. There is apparent free flow of contrast via the common bile duct into the  duodenum.  IMPRESSION: No biliary obstructing lesions seen.   Electronically Signed   By: Bretta Bang M.D.   On: 10/19/2013 14:26   Ct Angio Chest Pe W/cm &/or Wo Cm  10/18/2013   CLINICAL DATA:  Epigastric pain. Pleuritic left flank pain. Abdominal pain.  EXAM: CT ANGIOGRAPHY CHEST  CT ABDOMEN AND PELVIS WITH CONTRAST  TECHNIQUE: Multidetector CT imaging of the chest was performed using the standard protocol during bolus administration of intravenous contrast. Multiplanar CT image reconstructions including MIPs were obtained to evaluate the vascular anatomy. Multidetector CT imaging of the abdomen and pelvis was performed using the standard protocol during bolus administration of intravenous contrast.  CONTRAST:  50mL OMNIPAQUE IOHEXOL 300 MG/ML SOLN, OMNIPAQUE IOHEXOL 350 MG/ML SOLN  COMPARISON:  10/18/2013 abdominal ultrasound.  FINDINGS: CTA CHEST FINDINGS  Low-density prominence in the left thyroid lobe measures 1.7 cm and in the right thyroid lobe measures 1.8 cm.  No filling defect is identified in the pulmonary arterial tree to suggest pulmonary embolus. No acute aortic findings. No pericardial effusion. No pathologic thoracic adenopathy.  The lungs appear clear.  No pleural effusion.  CT ABDOMEN and PELVIS FINDINGS  The liver, spleen, pancreas, and adrenal glands appear unremarkable. Borderline gallbladder wall thickening. Kidneys and proximal ureters unremarkable. No pathologic upper abdominal adenopathy is observed. Small umbilical hernia contains adipose tissue. Appendix normal.  2.5 cm fluid density lesion along the left ovary margin. Urinary bladder mildly distended but otherwise unremarkable.  Posterior osseous ridging noted at L5-S1, with foraminal components  contributing to mild foraminal stenosis.  Review of the MIP images confirms the above findings.  IMPRESSION: 1. Bilateral low-density thyroid lesions. Consider further evaluation with thyroid ultrasound. If patient is clinically  hyperthyroid, consider nuclear medicine thyroid uptake and scan. 2. Borderline gallbladder wall thickening. 3. 2.5 cm fluid density lesion of the left ovary. Based on size criteria, this does not require followup. This recommendation follows ACR consensus guidelines: White Paper of the ACR Incidental Findings Committee II on Adnexal Findings. J Am Coll Radiol [phone]. 4. Posterior osseous ridging at L5-S1, likely causing mild bilateral foraminal stenosis. 5. Small umbilical hernia contains adipose tissue.   Electronically Signed   By: Herbie Baltimore M.D.   On: 10/18/2013 22:00   US Abdomen Complete  10/18/2013   CLINICAL DATA:  Right upper quadrant/epigastric pain. History of diabetes.  EXAM: ULTRASOUND ABDOMEN COMPLETE  COMPARISON:  None.  FINDINGS: Gallbladder  numerous gallstones with posterior shadowing, each measuring up to about 1.6 cm in diameter. Borderline gallbladder wall thickening at 3 mm. No pericholecystic fluid. Sonographic Murphy's sign: Absent.  Common bile duct  Diameter: 4 mm, within normal limits.  Liver  No focal lesion identified. Within normal limits in parenchymal echogenicity.  IVC  Limited visualization due to overlying bowel gas.  .  Pancreas  Limited visualization due to overlying bowel gas; visualized portion of pancreatic head and body unremarkable. Marland Kitchen  Spleen  Size and appearance within normal limits.  Right Kidney  Length: 13.9 cm. Echogenicity within normal limits. No mass or hydronephrosis visualized.  Left Kidney  Length: 13.0 cm. Echogenicity within normal limits. No mass or hydronephrosis visualized.  Abdominal aorta  No aneurysm visualized.  IMPRESSION: 1. Cholelithiasis, with borderline gallbladder wall thickening. No pericholecystic fluid or sonographic Murphy sign. Correlate clinically in assessing for acute cholecystitis. 2. Overlying bowel gas causes limited visualization of the IVC and pancreas.   Electronically Signed   By: Herbie Baltimore M.D.   On:  10/18/2013 18:55   Ct Abdomen Pelvis W Contrast  10/18/2013   CLINICAL DATA:  Epigastric pain. Pleuritic left flank pain. Abdominal pain.  EXAM: CT ANGIOGRAPHY CHEST  CT ABDOMEN AND PELVIS WITH CONTRAST  TECHNIQUE: Multidetector CT imaging of the chest was performed using the standard protocol during bolus administration of intravenous contrast. Multiplanar CT image reconstructions including MIPs were obtained to evaluate the vascular anatomy. Multidetector CT imaging of the abdomen and pelvis was performed using the standard protocol during bolus administration of intravenous contrast.  CONTRAST:  50mL OMNIPAQUE IOHEXOL 300 MG/ML SOLN, OMNIPAQUE IOHEXOL 350 MG/ML SOLN  COMPARISON:  10/18/2013 abdominal ultrasound.  FINDINGS: CTA CHEST FINDINGS  Low-density prominence in the left thyroid lobe measures 1.7 cm and in the right thyroid lobe measures 1.8 cm.  No filling defect is identified in the pulmonary arterial tree to suggest pulmonary embolus. No acute aortic findings. No pericardial effusion. No pathologic thoracic adenopathy.  The lungs appear clear.  No pleural effusion.  CT ABDOMEN and PELVIS FINDINGS  The liver, spleen, pancreas, and adrenal glands appear unremarkable. Borderline gallbladder wall thickening. Kidneys and proximal ureters unremarkable. No pathologic upper abdominal adenopathy is observed. Small umbilical hernia contains adipose tissue. Appendix normal.  2.5 cm fluid density lesion along the left ovary margin. Urinary bladder mildly distended but otherwise unremarkable.  Posterior osseous ridging noted at L5-S1, with foraminal components contributing to mild foraminal stenosis.  Review of the MIP images confirms the above findings.  IMPRESSION: 1. Bilateral low-density thyroid lesions. Consider further evaluation with  thyroid ultrasound. If patient is clinically hyperthyroid, consider nuclear medicine thyroid uptake and scan. 2. Borderline gallbladder wall thickening. 3. 2.5 cm fluid  density lesion of the left ovary. Based on size criteria, this does not require followup. This recommendation follows ACR consensus guidelines: White Paper of the ACR Incidental Findings Committee II on Adnexal Findings. J Am Coll Radiol [phone]. 4. Posterior osseous ridging at L5-S1, likely causing mild bilateral foraminal stenosis. 5. Small umbilical hernia contains adipose tissue.   Electronically Signed   By: Herbie Baltimore M.D.   On: 10/18/2013 22:00    Anti-infectives: Anti-infectives   Start     Dose/Rate Route Frequency Ordered Stop   10/19/13 0600  Ampicillin-Sulbactam (UNASYN) 3 g in sodium chloride 0.9 % 100 mL IVPB  Status:  Discontinued     3 g 100 mL/hr over 60 Minutes Intravenous Every 6 hours 10/19/13 0156 10/19/13 1248   10/18/13 2230  piperacillin-tazobactam (ZOSYN) IVPB 3.375 g     3.375 g 12.5 mL/hr over 240 Minutes Intravenous  Once 10/18/13 2219 10/19/13 0020   10/18/13 1833  cefTRIAXone (ROCEPHIN) 1 G injection    Comments:  Fabiola Backer   : cabinet override      10/18/13 1833 10/19/13 0644   10/18/13 1830  cefTRIAXone (ROCEPHIN) 1 g in dextrose 5 % 50 mL IVPB     1 g 100 mL/hr over 30 Minutes Intravenous  Once 10/18/13 1822 10/18/13 1937      Assessment/Plan: s/p Procedure(s): LAPAROSCOPIC CHOLECYSTECTOMY (N/A) Discharge Ibuprofen can also be used for pain. Advised to take stool softener.    LOS: 2 days    [facility] 10/20/2013

## 2013-10-20 NOTE — Progress Notes (Signed)
Assessment unchanged. Pt verbalized understanding of dc instructions through teach back. Script x 1 given as provided by MD. Familiar with My Chart with plans to sign up soon. Discharged via wc to front entrance to meet awaiting vehicle to carry home. Accompanied by NT and family.

## 2013-10-22 ENCOUNTER — Encounter (HOSPITAL_COMMUNITY): Payer: Self-pay | Admitting: General Surgery

## 2013-10-29 NOTE — Anesthesia Postprocedure Evaluation (Signed)
  Anesthesia Post-op Note  Patient: Angel Bradshaw  Procedure(s) Performed: Procedure(s) (LRB): LAPAROSCOPIC CHOLECYSTECTOMY (N/A)  Patient Location: PACU  Anesthesia Type: General  Level of Consciousness: awake and alert   Airway and Oxygen Therapy: Patient Spontanous Breathing  Post-op Pain: mild  Post-op Assessment: Post-op Vital signs reviewed, Patient's Cardiovascular Status Stable, Respiratory Function Stable, Patent Airway and No signs of Nausea or vomiting  Last Vitals:  Filed Vitals:   10/20/13 0615  BP: 119/82  Pulse: 67  Temp: 36.7 C  Resp: 18    Post-op Vital Signs: stable   Complications: No apparent anesthesia complications

## 2013-11-06 NOTE — Discharge Summary (Signed)
Physician Discharge Summary  Patient ID: Angel Bradshaw MRN: [id number] DOB/AGE: Jul 01, 1966 47 y.o.  Admit date: 10/18/2013 Discharge date: 11/06/2013  Admission Diagnoses:  Acute cholecystitis with chronic cholecystitis  Active Problems:  UTI (urinary tract infection)  Umbilical hernia  Discharge Diagnoses:  Principal Problem:   Acute cholecystitis with chronic cholecystitis Active Problems:   DM (diabetes mellitus)   UTI (urinary tract infection)   Incisional hernia - umbilical   Obesity (BMI 30-39.9)   Heartburn   PROCEDURES: Laparoscopic cholecystectomy with cholangiogram. 10/19/2013,Todd Laurie Panda, MD     Hospital Course:  Pleasant obese diabetic female. Hispanic origin. Bilingual. Husband is with her. He speaks Spanish easily and most English relatively well. She has a history of mild reflux in the past. She has had some episodes of intermittent abdominal pain nausea and bloating. Usually a bit mild. The symptoms are different than her heartburn. However, she started having more intense symptoms 3 days ago. Became more constant. The pain seemed to be more in the left upper abdomen. Her husband was concerned and brought her to the [facility] emergency room. Patient underwent extensive workup. PE and myocardial infarction ruled out some symptoms and be more up in the chest first. Ultrasound and CAT scan shows mildly thickened gallbladder wall with gallstones. Cholecystitis suspected. Urinalysis suspicious for urinary tract infection. Patient denies any dysuria pyuria or hematuria at this time. Because the gallbladder seemed to be the most likely symptom, surgical consultation requested. Patient transferred to [facility] emergency department so that surgery consult could be made.  No exertional chest/neck/shoulder/arm pain. Patient can walk 30 minutes for about 1 miles without difficulty. No personal nor family history of GI/colon cancer, inflammatory bowel disease, irritable bowel  syndrome, allergy such as Celiac Sprue, dietary/dairy problems, colitis, ulcers nor gastritis. No recent sick contacts/gastroenteritis. No travel outside the country. No changes in diet. She denies any alcohol use. No history of pancreatitis. No history of jaundice. No dark colored urine or light/clay-colored stools. No history of hepatitis. No history of fall or trauma. She had a tubal ligation done at her belly button but no other abdominal surgeries. She does occasionally fill pulling a discomfort at her bellybutton, especially at the left side.  She was admitted on 10/19/13, and underwent surgery later on that day.  She was seen and discharged home the following day by Dr. Donell Beers.  Follow up in 2 weeks.      Disposition: 01-Home or Self Care   Future Appointments Provider Department Dept Phone   11/07/2013 9:50 AM Adolph Pollack, MD [facility], [place] [phone]       Medication List         aspirin EC 81 MG tablet  Take 81 mg by mouth daily.     docusate sodium 100 MG capsule  Commonly known as:  COLACE  Take 1 capsule (100 mg total) by mouth daily. May d/c when off narcotics.     fish oil-omega-3 fatty acids 1000 MG capsule  Take 1 g by mouth daily.     ibuprofen 600 MG tablet  Commonly known as:  ADVIL,MOTRIN  Take 1 tablet (600 mg total) by mouth every 6 (six) hours as needed.     INVOKANA 100 MG Tabs  Generic drug:  Canagliflozin  Take 100 mg by mouth daily.     metFORMIN 1000 MG tablet  Commonly known as:  GLUCOPHAGE  Take 1,000 mg by mouth 2 (two) times daily with a meal.     oxyCODONE  5 MG immediate release tablet  Commonly known as:  Oxy IR/ROXICODONE  Take 1-2 tablets (5-10 mg total) by mouth every 4 (four) hours as needed for moderate pain, severe pain or breakthrough pain.     sitaGLIPtin 100 MG tablet  Commonly known as:  JANUVIA  Take 100 mg by mouth daily.           Follow-up Information   Follow up with ROSENBOWER,TODD J, MD.  Schedule an appointment as soon as possible for a visit in 2 weeks. (Make an appointment in 2-3 weeks.)    Specialty:  General Surgery   Contact information:   [address] [place] [address] [phone]       Signed: Sherrie George 11/06/2013, 5:18 PM

## 2013-11-07 ENCOUNTER — Ambulatory Visit (INDEPENDENT_AMBULATORY_CARE_PROVIDER_SITE_OTHER): Payer: BC Managed Care – PPO | Admitting: General Surgery

## 2013-11-07 ENCOUNTER — Encounter (INDEPENDENT_AMBULATORY_CARE_PROVIDER_SITE_OTHER): Payer: Self-pay | Admitting: General Surgery

## 2013-11-07 VITALS — BP 120/78 | HR 80 | Temp 98.1°F | Resp 14 | Ht 64.0 in | Wt 175.4 lb

## 2013-11-07 DIAGNOSIS — Z4889 Encounter for other specified surgical aftercare: Secondary | ICD-10-CM

## 2013-11-07 NOTE — Patient Instructions (Signed)
Activities as tolerated. Recommend a low-fat diet.

## 2013-11-07 NOTE — Progress Notes (Signed)
She is here for a postop visit following urgent laparoscopic cholecystectomy.  Diet is being tolerated, bowels are moving.   Minimal discomfort around subumbilical incision.  Pathology demonstrates chronic cholecystitis and cholelithiasis. She had some gross changes of acute cholecystitis intraoperatively.  PE:  Gen-She looks well.  ABD:  Soft, incisions clean/dry/intact and solid.  Assessment:  Doing well postop.  Plan:  Lowfat diet recommended.  Activities as tolerated.  Return visit prn.

## 2014-09-30 ENCOUNTER — Encounter (INDEPENDENT_AMBULATORY_CARE_PROVIDER_SITE_OTHER): Payer: Self-pay | Admitting: General Surgery

## 2015-08-26 ENCOUNTER — Encounter (HOSPITAL_BASED_OUTPATIENT_CLINIC_OR_DEPARTMENT_OTHER): Payer: Self-pay | Admitting: Emergency Medicine

## 2015-08-26 ENCOUNTER — Emergency Department (HOSPITAL_BASED_OUTPATIENT_CLINIC_OR_DEPARTMENT_OTHER): Payer: Medicaid Other

## 2015-08-26 ENCOUNTER — Inpatient Hospital Stay (HOSPITAL_BASED_OUTPATIENT_CLINIC_OR_DEPARTMENT_OTHER)
Admission: EM | Admit: 2015-08-26 | Discharge: 2015-08-29 | DRG: 872 | Disposition: A | Discharge status: Home / Self Care | Payer: Medicaid Other | Attending: Internal Medicine | Admitting: Internal Medicine

## 2015-08-26 DIAGNOSIS — T83511A Infection and inflammatory reaction due to indwelling urethral catheter, initial encounter: Secondary | ICD-10-CM

## 2015-08-26 DIAGNOSIS — A4151 Sepsis due to Escherichia coli [E. coli]: Principal | ICD-10-CM | POA: Diagnosis present

## 2015-08-26 DIAGNOSIS — Z833 Family history of diabetes mellitus: Secondary | ICD-10-CM

## 2015-08-26 DIAGNOSIS — K432 Incisional hernia without obstruction or gangrene: Secondary | ICD-10-CM

## 2015-08-26 DIAGNOSIS — E860 Dehydration: Secondary | ICD-10-CM | POA: Diagnosis present

## 2015-08-26 DIAGNOSIS — N390 Urinary tract infection, site not specified: Secondary | ICD-10-CM

## 2015-08-26 DIAGNOSIS — R12 Heartburn: Secondary | ICD-10-CM

## 2015-08-26 DIAGNOSIS — E118 Type 2 diabetes mellitus with unspecified complications: Secondary | ICD-10-CM

## 2015-08-26 DIAGNOSIS — N12 Tubulo-interstitial nephritis, not specified as acute or chronic: Secondary | ICD-10-CM | POA: Diagnosis present

## 2015-08-26 DIAGNOSIS — R109 Unspecified abdominal pain: Secondary | ICD-10-CM | POA: Diagnosis present

## 2015-08-26 DIAGNOSIS — E669 Obesity, unspecified: Secondary | ICD-10-CM

## 2015-08-26 DIAGNOSIS — E119 Type 2 diabetes mellitus without complications: Secondary | ICD-10-CM | POA: Diagnosis present

## 2015-08-26 DIAGNOSIS — E872 Acidosis: Secondary | ICD-10-CM | POA: Diagnosis present

## 2015-08-26 DIAGNOSIS — K812 Acute cholecystitis with chronic cholecystitis: Secondary | ICD-10-CM

## 2015-08-26 DIAGNOSIS — Z7982 Long term (current) use of aspirin: Secondary | ICD-10-CM

## 2015-08-26 DIAGNOSIS — A419 Sepsis, unspecified organism: Secondary | ICD-10-CM

## 2015-08-26 LAB — COMPREHENSIVE METABOLIC PANEL
ALT: 10 U/L — ABNORMAL LOW (ref 14–54)
ANION GAP: 9 (ref 5–15)
AST: 16 U/L (ref 15–41)
Albumin: 4.2 g/dL (ref 3.5–5.0)
Alkaline Phosphatase: 68 U/L (ref 38–126)
BILIRUBIN TOTAL: 0.5 mg/dL (ref 0.3–1.2)
BUN: 7 mg/dL (ref 6–20)
CHLORIDE: 102 mmol/L (ref 101–111)
CO2: 24 mmol/L (ref 22–32)
Calcium: 9 mg/dL (ref 8.9–10.3)
Creatinine, Ser: 0.77 mg/dL (ref 0.44–1.00)
Glucose, Bld: 197 mg/dL — ABNORMAL HIGH (ref 65–99)
POTASSIUM: 4.2 mmol/L (ref 3.5–5.1)
Sodium: 135 mmol/L (ref 135–145)
TOTAL PROTEIN: 7.5 g/dL (ref 6.5–8.1)

## 2015-08-26 LAB — URINALYSIS, ROUTINE W REFLEX MICROSCOPIC
Bilirubin Urine: NEGATIVE
Glucose, UA: NEGATIVE mg/dL
KETONES UR: NEGATIVE mg/dL
NITRITE: NEGATIVE
PROTEIN: NEGATIVE mg/dL
Specific Gravity, Urine: 1.003 — ABNORMAL LOW (ref 1.005–1.030)
UROBILINOGEN UA: 0.2 mg/dL (ref 0.0–1.0)
pH: 7 (ref 5.0–8.0)

## 2015-08-26 LAB — HCG, SERUM, QUALITATIVE: PREG SERUM: NEGATIVE

## 2015-08-26 LAB — CBC WITH DIFFERENTIAL/PLATELET
BASOS ABS: 0 10*3/uL (ref 0.0–0.1)
BASOS PCT: 0 %
Eosinophils Absolute: 0 10*3/uL (ref 0.0–0.7)
Eosinophils Relative: 0 %
HEMATOCRIT: 37.8 % (ref 36.0–46.0)
Hemoglobin: 12.4 g/dL (ref 12.0–15.0)
LYMPHS PCT: 7 %
Lymphs Abs: 1.1 10*3/uL (ref 0.7–4.0)
MCH: 26.2 pg (ref 26.0–34.0)
MCHC: 32.8 g/dL (ref 30.0–36.0)
MCV: 79.9 fL (ref 78.0–100.0)
MONO ABS: 1 10*3/uL (ref 0.1–1.0)
Monocytes Relative: 7 %
NEUTROS ABS: 13 10*3/uL — AB (ref 1.7–7.7)
Neutrophils Relative %: 86 %
PLATELETS: 323 10*3/uL (ref 150–400)
RBC: 4.73 MIL/uL (ref 3.87–5.11)
RDW: 14.4 % (ref 11.5–15.5)
WBC: 15.2 10*3/uL — AB (ref 4.0–10.5)

## 2015-08-26 LAB — URINE MICROSCOPIC-ADD ON

## 2015-08-26 LAB — I-STAT CG4 LACTIC ACID, ED: Lactic Acid, Venous: 2.12 mmol/L (ref 0.5–2.0)

## 2015-08-26 MED ORDER — MORPHINE SULFATE (PF) 4 MG/ML IV SOLN
4.0000 mg | Freq: Once | INTRAVENOUS | Status: AC
  Administered 2015-08-26: 4 mg via INTRAVENOUS
  Filled 2015-08-26: qty 1

## 2015-08-26 MED ORDER — ACETAMINOPHEN 325 MG PO TABS
650.0000 mg | ORAL_TABLET | Freq: Once | ORAL | Status: AC | PRN
  Administered 2015-08-26: 650 mg via ORAL
  Filled 2015-08-26: qty 2

## 2015-08-26 MED ORDER — PIPERACILLIN-TAZOBACTAM 4.5 G IVPB
4.5000 g | Freq: Once | INTRAVENOUS | Status: DC
  Filled 2015-08-26: qty 100

## 2015-08-26 MED ORDER — SODIUM CHLORIDE 0.9 % IV BOLUS (SEPSIS)
1000.0000 mL | Freq: Once | INTRAVENOUS | Status: AC
  Administered 2015-08-26: 1000 mL via INTRAVENOUS

## 2015-08-26 MED ORDER — ONDANSETRON HCL 4 MG/2ML IJ SOLN
4.0000 mg | Freq: Once | INTRAMUSCULAR | Status: AC
  Administered 2015-08-26: 4 mg via INTRAVENOUS
  Filled 2015-08-26: qty 2

## 2015-08-26 NOTE — ED Provider Notes (Signed)
CSN: [id number]     Arrival date & time 08/26/15  2218 History  By signing my name below, I, Angel Bradshaw, attest that this documentation has been prepared under the direction and in the presence of Lavera Guise, MD. Electronically Signed: Lyndel Bradshaw, ED Scribe. 08/26/2015. 1:16 AM.  Chief Complaint  Patient presents with  . Fever   The history is provided by the patient. No language interpreter was used.   HPI Comments: Angel Bradshaw is a 49 y.o. female, with a PShx of tubal ligation and cholecystectomy, who presents to the Emergency Department complaining of ongoing urinary symptoms of frequency X 2 weeks despite treatment with doxycycline. She associates left flank pain onset today approximately 2.5 hours ago with chills, diaphoresis, myalgias, nausea and increased frequency. Dysuria has resolved. Pt has a fever of 102F on triage but has not taken her temperature at home.  Pt has been taking ibuprofen for the past 3 days with no relief of pain. PMhx of DM; CBG is 130 at baseline but was 173 today. Denies vaginal discharge and noticed vaginal spotting today, expecting menses towards the end of this month.  Past Medical History  Diagnosis Date  . Diabetes mellitus    Past Surgical History  Procedure Laterality Date  . Tubal ligation    . Cholecystectomy N/A 10/19/2013    Procedure: LAPAROSCOPIC CHOLECYSTECTOMY;  Surgeon: Adolph Pollack, MD;  Location: [facility] ORS;  Service: General;  Laterality: N/A;   Family History  Problem Relation Age of Onset  . Diabetes Mother    Social History  Substance Use Topics  . Smoking status: Never Smoker   . Smokeless tobacco: Never Used  . Alcohol Use: No   OB History    Gravida Para Term Preterm AB TAB SAB Ectopic Multiple Living   Review of Systems  Constitutional: Positive for fever, chills and diaphoresis.  Genitourinary: Positive for urgency, frequency, flank pain (left) and vaginal bleeding. Negative for dysuria  and vaginal discharge.  Musculoskeletal: Positive for myalgias.  All other systems reviewed and are negative.  Allergies  Review of patient's allergies indicates no known allergies.  Home Medications   Prior to Admission medications   Medication Sig Start Date End Date Taking? Authorizing Provider  aspirin EC 81 MG tablet Take 81 mg by mouth daily.    Historical Provider, MD  Canagliflozin (INVOKANA) 100 MG TABS Take 100 mg by mouth daily.     Historical Provider, MD  fish oil-omega-3 fatty acids 1000 MG capsule Take 1 g by mouth daily.    Historical Provider, MD  ibuprofen (ADVIL,MOTRIN) 600 MG tablet Take 1 tablet (600 mg total) by mouth every 6 (six) hours as needed. 10/20/13   Almond Lint, MD  lisinopril (PRINIVIL,ZESTRIL) 20 MG tablet Take 20 mg by mouth daily.    Historical Provider, MD  metFORMIN (GLUCOPHAGE) 1000 MG tablet Take 1,000 mg by mouth 2 (two) times daily with a meal.    Historical Provider, MD   BP 149/91 mmHg  Pulse 136  Temp(Src) 102 F (38.9 C) (Oral)  Resp 26  Wt 172 lb (78.019 kg)  SpO2 100% Physical Exam Physical Exam  Nursing note and vitals reviewed. Constitutional: Well developed, well nourished, non-toxic, and in no acute distress Head: Normocephalic and atraumatic.  Mouth/Throat: Oropharynx is clear and moist.  Neck: Normal range of motion. Neck supple.  Cardiovascular: Tachycardic rate and regular rhythm.   Pulmonary/Chest:  Effort normal and breath sounds normal.  Abdominal: Non-distended. Soft. There is no abdominal tenderness. Left CVA tenderness. There is no rebound and no guarding.  Musculoskeletal: Normal range of motion.  Neurological: Alert, no facial droop, fluent speech, moves all extremities symmetrically Skin: Skin is warm and dry.  Psychiatric: Cooperative   ED Course  Procedures  DIAGNOSTIC STUDIES: Oxygen Saturation is 100% on RA, normal by my interpretation.    COORDINATION OF CARE: 10:41 PM Discussed treatment plan with pt  at bedside and pt agreed to plan.   Labs Review Labs Reviewed  COMPREHENSIVE METABOLIC PANEL - Abnormal; Notable for the following:    Glucose, Bld 197 (*)    ALT 10 (*)    All other components within normal limits  URINALYSIS, ROUTINE W REFLEX MICROSCOPIC (NOT AT [facility]) - Abnormal; Notable for the following:    Specific Gravity, Urine 1.003 (*)    Hgb urine dipstick LARGE (*)    Leukocytes, UA SMALL (*)    All other components within normal limits  CBC WITH DIFFERENTIAL/PLATELET - Abnormal; Notable for the following:    WBC 15.2 (*)    Neutro Abs 13.0 (*)    All other components within normal limits  I-STAT CG4 LACTIC ACID, ED - Abnormal; Notable for the following:    Lactic Acid, Venous 2.12 (*)    All other components within normal limits  CBG MONITORING, ED - Abnormal; Notable for the following:    Glucose-Capillary 191 (*)    All other components within normal limits  URINE CULTURE  CULTURE, BLOOD (ROUTINE X 2)  CULTURE, BLOOD (ROUTINE X 2)  HCG, SERUM, QUALITATIVE  URINE MICROSCOPIC-ADD ON  I-STAT CG4 LACTIC ACID, ED  I-STAT CG4 LACTIC ACID, ED    Imaging Review Ct Renal Stone Study  08/27/2015   CLINICAL DATA:  Left flank pain, onset today. Urinary frequency for 2 weeks.  EXAM: CT ABDOMEN AND PELVIS WITHOUT CONTRAST  TECHNIQUE: Multidetector CT imaging of the abdomen and pelvis was performed following the standard protocol without IV contrast.  COMPARISON:  CT 11/17/2013  FINDINGS: The included lung bases are clear.  No renal stones or urolithiasis. No hydronephrosis. There is mild asymmetric left perinephric edema. The left ureter is mildly prominent, however no stone throughout its course. No right perinephric edema.  Evaluation of the remaining solid and hollow viscera is limited given lack of contrast. The liver appears prominent in size measuring 22 cm in cranial caudal dimension. Clips in the gallbladder fossa postcholecystectomy. The unenhanced spleen, adrenal glands,  and pancreas are normal.  Stomach is distended with ingested contents. There are no dilated or thickened bowel loops. Ingested contents throughout small bowel loops in the left abdomen. Small to moderate volume of colonic stool without colonic wall thickening. The appendix is normal. No free air, free fluid, or intra-abdominal fluid collection.  No retroperitoneal adenopathy. Abdominal aorta is normal in caliber. There is small umbilical hernia.  Within the pelvis the bladder is physiologically distended, no bladder wall thickening. Uterus appears prominent in size. Probable 2.6 cm cyst in the left ovary. Right ovary not confidently seen. No pelvic free fluid.  There is degenerative disc disease at L5-S1. There are no acute or suspicious osseous abnormalities.  IMPRESSION: Mild left perinephric edema, may reflect underlying pyelonephritis. No urolithiasis or obstructive uropathy.   Electronically Signed   By: Rubye Oaks M.D.   On: 08/27/2015 01:11   I have personally reviewed and evaluated these images and lab results as part  of my medical decision-making.   MDM   Final diagnoses:  Left flank pain  Sepsis, due to unspecified organism  Pyelonephritis   49 year old female with history of diabetes who presents with fever, left flank pain, and urinary frequency despite recent outpatient treatment for UTI. Febrile and tachycardic on presentation, but normotensive and no respiratory distress. Abdomen benign, but left CVA tenderness on exam. Lactate at 2.1 and leukocytosis of 15. No significant end organ damage noted. Presentation c/w sepsis 2/2 pyelonephritis. CT performed to r/o perinephric abscess vs urolithiasis given recent treatment for UTI. Imaging revealing signs of pyelonephritis. Given tylenol with persistent fever and tachycardia. Given additional motrin and 2L of IVF. Started on zosyn as well. Discussed with triad hospitalist and admitted for ongoing management.  CRITICAL CARE Performed by:  Lavera Guise   Total critical care time: 35 minutes  Critical care time was exclusive of separately billable procedures and treating other patients.  Critical care was necessary to treat or prevent imminent or life-threatening deterioration.  Critical care was time spent personally by me on the following activities: development of treatment plan with patient and/or surrogate as well as nursing, discussions with consultants, evaluation of patient's response to treatment, examination of patient, obtaining history from patient or surrogate, ordering and performing treatments and interventions, ordering and review of laboratory studies, ordering and review of radiographic studies, pulse oximetry and re-evaluation of patient's condition.   I personally performed the services described in this documentation, which was scribed in my presence. The recorded information has been reviewed and is accurate.    Lavera Guise, MD 08/27/15 0120

## 2015-08-26 NOTE — ED Notes (Addendum)
Presents with 2 weeks of urinary frequency and dysuria,  left flank pain and today around 8 pm, begna having chills, cold sweats and urgency and frequency more than previously. Temp 102.0 HR 136.

## 2015-08-27 DIAGNOSIS — R1032 Left lower quadrant pain: Secondary | ICD-10-CM | POA: Diagnosis present

## 2015-08-27 DIAGNOSIS — A419 Sepsis, unspecified organism: Secondary | ICD-10-CM

## 2015-08-27 DIAGNOSIS — Z7982 Long term (current) use of aspirin: Secondary | ICD-10-CM | POA: Diagnosis not present

## 2015-08-27 DIAGNOSIS — E872 Acidosis: Secondary | ICD-10-CM | POA: Diagnosis present

## 2015-08-27 DIAGNOSIS — Z833 Family history of diabetes mellitus: Secondary | ICD-10-CM | POA: Diagnosis not present

## 2015-08-27 DIAGNOSIS — N12 Tubulo-interstitial nephritis, not specified as acute or chronic: Secondary | ICD-10-CM | POA: Diagnosis present

## 2015-08-27 DIAGNOSIS — E119 Type 2 diabetes mellitus without complications: Secondary | ICD-10-CM | POA: Diagnosis present

## 2015-08-27 DIAGNOSIS — E118 Type 2 diabetes mellitus with unspecified complications: Secondary | ICD-10-CM

## 2015-08-27 DIAGNOSIS — R10A2 Flank pain, left side: Secondary | ICD-10-CM | POA: Diagnosis present

## 2015-08-27 DIAGNOSIS — IMO0001 Reserved for inherently not codable concepts without codable children: Secondary | ICD-10-CM | POA: Insufficient documentation

## 2015-08-27 DIAGNOSIS — R109 Unspecified abdominal pain: Secondary | ICD-10-CM

## 2015-08-27 DIAGNOSIS — E860 Dehydration: Secondary | ICD-10-CM | POA: Diagnosis present

## 2015-08-27 DIAGNOSIS — A4151 Sepsis due to Escherichia coli [E. coli]: Secondary | ICD-10-CM | POA: Diagnosis present

## 2015-08-27 LAB — COMPREHENSIVE METABOLIC PANEL
ALBUMIN: 3 g/dL — AB (ref 3.5–5.0)
ALT: 9 U/L — AB (ref 14–54)
AST: 13 U/L — AB (ref 15–41)
Alkaline Phosphatase: 53 U/L (ref 38–126)
Anion gap: 7 (ref 5–15)
BUN: 6 mg/dL (ref 6–20)
CHLORIDE: 108 mmol/L (ref 101–111)
CO2: 21 mmol/L — ABNORMAL LOW (ref 22–32)
CREATININE: 0.6 mg/dL (ref 0.44–1.00)
Calcium: 7.8 mg/dL — ABNORMAL LOW (ref 8.9–10.3)
GFR calc Af Amer: >60 mL/min (ref >60)
GLUCOSE: 162 mg/dL — AB (ref 65–99)
POTASSIUM: 3.7 mmol/L (ref 3.5–5.1)
Sodium: 136 mmol/L (ref 135–145)
Total Bilirubin: 0.4 mg/dL (ref 0.3–1.2)
Total Protein: 5.5 g/dL — ABNORMAL LOW (ref 6.5–8.1)

## 2015-08-27 LAB — GLUCOSE, CAPILLARY
GLUCOSE-CAPILLARY: 162 mg/dL — AB (ref 65–99)
GLUCOSE-CAPILLARY: 184 mg/dL — AB (ref 65–99)
GLUCOSE-CAPILLARY: 179 mg/dL — AB (ref 65–99)
Glucose-Capillary: 185 mg/dL — ABNORMAL HIGH (ref 65–99)
Glucose-Capillary: 155 mg/dL — ABNORMAL HIGH (ref 65–99)

## 2015-08-27 LAB — CBC
HEMATOCRIT: 33.7 % — AB (ref 36.0–46.0)
Hemoglobin: 10.7 g/dL — ABNORMAL LOW (ref 12.0–15.0)
MCH: 25.5 pg — ABNORMAL LOW (ref 26.0–34.0)
MCHC: 31.8 g/dL (ref 30.0–36.0)
MCV: 80.4 fL (ref 78.0–100.0)
PLATELETS: 279 10*3/uL (ref 150–400)
RBC: 4.19 MIL/uL (ref 3.87–5.11)
RDW: 14.4 % (ref 11.5–15.5)
WBC: 12.2 10*3/uL — AB (ref 4.0–10.5)

## 2015-08-27 LAB — CBG MONITORING, ED: Glucose-Capillary: 191 mg/dL — ABNORMAL HIGH (ref 65–99)

## 2015-08-27 MED ORDER — OXYCODONE-ACETAMINOPHEN 5-325 MG PO TABS
1.0000 | ORAL_TABLET | ORAL | Status: DC | PRN
  Administered 2015-08-27: 2 via ORAL
  Administered 2015-08-27 (×2): 1 via ORAL
  Administered 2015-08-28: 2 via ORAL
  Administered 2015-08-28: 1 via ORAL
  Administered 2015-08-28: 2 via ORAL
  Filled 2015-08-27: qty 2
  Filled 2015-08-27: qty 1
  Filled 2015-08-27: qty 2
  Filled 2015-08-27 (×2): qty 1
  Filled 2015-08-27: qty 2

## 2015-08-27 MED ORDER — ENOXAPARIN SODIUM 40 MG/0.4ML ~~LOC~~ SOLN
40.0000 mg | SUBCUTANEOUS | Status: DC
  Administered 2015-08-27 – 2015-08-28 (×2): 40 mg via SUBCUTANEOUS
  Filled 2015-08-27 (×2): qty 0.4

## 2015-08-27 MED ORDER — INSULIN ASPART 100 UNIT/ML ~~LOC~~ SOLN: 0.0000 [IU] | Freq: Every day | SUBCUTANEOUS | Status: DC

## 2015-08-27 MED ORDER — ONDANSETRON HCL 4 MG/2ML IJ SOLN
4.0000 mg | Freq: Four times a day (QID) | INTRAMUSCULAR | Status: DC | PRN
Start: 2015-08-27 — End: 2015-08-29

## 2015-08-27 MED ORDER — INSULIN ASPART 100 UNIT/ML ~~LOC~~ SOLN
0.0000 [IU] | Freq: Three times a day (TID) | SUBCUTANEOUS | Status: DC
  Administered 2015-08-27 (×3): 3 [IU] via SUBCUTANEOUS
  Administered 2015-08-28: 2 [IU] via SUBCUTANEOUS
  Administered 2015-08-28: 5 [IU] via SUBCUTANEOUS
  Administered 2015-08-28: 3 [IU] via SUBCUTANEOUS
  Administered 2015-08-29: 2 [IU] via SUBCUTANEOUS

## 2015-08-27 MED ORDER — SODIUM CHLORIDE 0.9 % IV SOLN
INTRAVENOUS | Status: DC
  Administered 2015-08-27: 1000 mL via INTRAVENOUS

## 2015-08-27 MED ORDER — SODIUM CHLORIDE 0.9 % IV SOLN
INTRAVENOUS | Status: DC
  Administered 2015-08-27: 21:00:00 via INTRAVENOUS

## 2015-08-27 MED ORDER — ACETAMINOPHEN 325 MG PO TABS
650.0000 mg | ORAL_TABLET | Freq: Four times a day (QID) | ORAL | Status: DC | PRN
  Administered 2015-08-27 – 2015-08-29 (×3): 650 mg via ORAL
  Filled 2015-08-27 (×3): qty 2

## 2015-08-27 MED ORDER — SODIUM CHLORIDE 0.9 % IJ SOLN: 3.0000 mL | Freq: Two times a day (BID) | INTRAMUSCULAR | Status: DC

## 2015-08-27 MED ORDER — LISINOPRIL 20 MG PO TABS: 20.0000 mg | ORAL_TABLET | Freq: Every day | ORAL | Status: DC

## 2015-08-27 MED ORDER — INFLUENZA VAC SPLIT QUAD 0.5 ML IM SUSY
0.5000 mL | PREFILLED_SYRINGE | INTRAMUSCULAR | Status: AC
  Administered 2015-08-29: 0.5 mL via INTRAMUSCULAR
  Filled 2015-08-27: qty 0.5

## 2015-08-27 MED ORDER — PIPERACILLIN-TAZOBACTAM 3.375 G IVPB
INTRAVENOUS | Status: AC
  Administered 2015-08-27: 3.375 g
  Filled 2015-08-27: qty 50

## 2015-08-27 MED ORDER — DEXTROSE 5 % IV SOLN
1.0000 g | INTRAVENOUS | Status: DC
  Administered 2015-08-27 – 2015-08-28 (×2): 1 g via INTRAVENOUS
  Filled 2015-08-27 (×2): qty 10

## 2015-08-27 MED ORDER — IBUPROFEN 400 MG PO TABS
600.0000 mg | ORAL_TABLET | Freq: Once | ORAL | Status: AC
  Administered 2015-08-27: 600 mg via ORAL
  Filled 2015-08-27 (×2): qty 1

## 2015-08-27 MED ORDER — ONDANSETRON HCL 4 MG PO TABS: 4.0000 mg | ORAL_TABLET | Freq: Four times a day (QID) | ORAL | Status: DC | PRN

## 2015-08-27 MED ORDER — ACETAMINOPHEN 650 MG RE SUPP: 650.0000 mg | Freq: Four times a day (QID) | RECTAL | Status: DC | PRN

## 2015-08-27 MED ORDER — ASPIRIN EC 81 MG PO TBEC
81.0000 mg | DELAYED_RELEASE_TABLET | Freq: Every day | ORAL | Status: DC
  Administered 2015-08-27 – 2015-08-28 (×2): 81 mg via ORAL
  Filled 2015-08-27 (×2): qty 1

## 2015-08-27 NOTE — H&P (Signed)
[facility] History and Physical  Patient: Angel Bradshaw  MRN: [id number]  DOB: Apr 22, 1966  DOS: the patient was seen and examined on 08/27/2015 PCP: [facility], FNP  Referring physician: Dr. Verdie Mosher Chief Complaint: Left flank pain  HPI: Angel Bradshaw is a 49 y.o. female with Past medical history of diabetes mellitus. The patient is presenting with complaints of left-sided flank pain. She also has complains of increasing urinary frequency. This is on ongoing since last 2 weeks. She also had fever with chills as well as sweating. Next and she compresses of nausea without vomiting. Since last few days he started noticing pain on her left-sided flank and this has been progressively worsening and therefore decided to come to the hospital. She denies any blood in the urine. No diarrhea nausea vomiting at the time of my evaluation. Her pain is also improved. She denies any focal deficit. She initially tried to self treat herself with tetracycline but since her symptoms were not improving she was brought to the ER. She received Zosyn there. She does not take her Invokana since she has ran out of it. She is taking metformin since last few days.  The patient is coming from home.  At her baseline ambulates  And is independent for most of her ADL; manages her medication on her own.  Review of Systems: as mentioned in the history of present illness.  A comprehensive review of the other systems is negative.  Past Medical History  Diagnosis Date  . Diabetes mellitus    Past Surgical History  Procedure Laterality Date  . Tubal ligation    . Cholecystectomy N/A 10/19/2013    Procedure: LAPAROSCOPIC CHOLECYSTECTOMY;  Surgeon: Adolph Pollack, MD;  Location: [facility];  Service: General;  Laterality: N/A;   Social History:  reports that she has never smoked. She has never used smokeless tobacco. She reports that she does not drink alcohol or use illicit drugs.  No Known  Allergies  Family History  Problem Relation Age of Onset  . Diabetes Mother     Prior to Admission medications   Medication Sig Start Date End Date Taking? Authorizing Caeli Linehan  aspirin EC 81 MG tablet Take 81 mg by mouth daily.    Historical Debraann Livingstone, MD  Canagliflozin (INVOKANA) 100 MG TABS Take 100 mg by mouth daily.     Historical Breck Maryland, MD  fish oil-omega-3 fatty acids 1000 MG capsule Take 1 g by mouth daily.    Historical Shuna Tabor, MD  ibuprofen (ADVIL,MOTRIN) 600 MG tablet Take 1 tablet (600 mg total) by mouth every 6 (six) hours as needed. 10/20/13   Almond Lint, MD  lisinopril (PRINIVIL,ZESTRIL) 20 MG tablet Take 20 mg by mouth daily.    Historical Tevita Gomer, MD  metFORMIN (GLUCOPHAGE) 1000 MG tablet Take 1,000 mg by mouth 2 (two) times daily with a meal.    Historical Rameen Quinney, MD    Physical Exam: Filed Vitals:   08/27/15 0100 08/27/15 0104 08/27/15 0159 08/27/15 0433  BP: 123/76 123/76 142/75 97/50  Pulse: 136 139 134 100  Temp:  101.2 F (38.4 C) 98.9 F (37.2 C) 98.2 F (36.8 C)  TempSrc:  Oral Oral Oral  Resp: Weight:   82.918 kg (182 lb 12.8 oz)   SpO2: 100% 98% 100% 99%    General: Alert, Awake and Oriented to Time, Place and Person. Appear in mild distress Eyes: PERRL ENT: Oral Mucosa clear moist. Neck: no JVD Cardiovascular: S1 and S2 Present, no Murmur, Peripheral  Pulses Present Respiratory: Bilateral Air entry equal and Decreased,  Clear to Auscultation, no Crackles, no wheezes Abdomen: Bowel Sound present, Soft and left-sided lower quadrant tenderness Skin: no Rash Extremities: no Pedal edema, no calf tenderness Neurologic: Grossly no focal neuro deficit.  Labs on Admission:  CBC:  Recent Labs Lab 08/26/15 2322  WBC 15.2*  NEUTROABS 13.0*  HGB 12.4  HCT 37.8  MCV 79.9  PLT 323    CMP     Component Value Date/Time   NA 135 08/26/2015 2322   K 4.2 08/26/2015 2322   CL 102 08/26/2015 2322   CO2 24 08/26/2015 2322    GLUCOSE 197* 08/26/2015 2322   BUN 7 08/26/2015 2322   CREATININE 0.77 08/26/2015 2322   CALCIUM 9.0 08/26/2015 2322   PROT 7.5 08/26/2015 2322   ALBUMIN 4.2 08/26/2015 2322   AST 16 08/26/2015 2322   ALT 10* 08/26/2015 2322   ALKPHOS 68 08/26/2015 2322   BILITOT 0.5 08/26/2015 2322   GFRNONAA >60 08/26/2015 2322   GFRAA >60 08/26/2015 2322    No results for input(s): CKTOTAL, CKMB, CKMBINDEX, TROPONINI in the last 168 hours. BNP (last 3 results) No results for input(s): BNP in the last 8760 hours.  ProBNP (last 3 results) No results for input(s): PROBNP in the last 8760 hours.   Radiological Exams on Admission: Ct Renal Stone Study  08/27/2015   CLINICAL DATA:  Left flank pain, onset today. Urinary frequency for 2 weeks.  EXAM: CT ABDOMEN AND PELVIS WITHOUT CONTRAST  TECHNIQUE: Multidetector CT imaging of the abdomen and pelvis was performed following the standard protocol without IV contrast.  COMPARISON:  CT 11/17/2013  FINDINGS: The included lung bases are clear.  No renal stones or urolithiasis. No hydronephrosis. There is mild asymmetric left perinephric edema. The left ureter is mildly prominent, however no stone throughout its course. No right perinephric edema.  Evaluation of the remaining solid and hollow viscera is limited given lack of contrast. The liver appears prominent in size measuring 22 cm in cranial caudal dimension. Clips in the gallbladder fossa postcholecystectomy. The unenhanced spleen, adrenal glands, and pancreas are normal.  Stomach is distended with ingested contents. There are no dilated or thickened bowel loops. Ingested contents throughout small bowel loops in the left abdomen. Small to moderate volume of colonic stool without colonic wall thickening. The appendix is normal. No free air, free fluid, or intra-abdominal fluid collection.  No retroperitoneal adenopathy. Abdominal aorta is normal in caliber. There is small umbilical hernia.  Within the pelvis the  bladder is physiologically distended, no bladder wall thickening. Uterus appears prominent in size. Probable 2.6 cm cyst in the left ovary. Right ovary not confidently seen. No pelvic free fluid.  There is degenerative disc disease at L5-S1. There are no acute or suspicious osseous abnormalities.  IMPRESSION: Mild left perinephric edema, may reflect underlying pyelonephritis. No urolithiasis or obstructive uropathy.   Electronically Signed   By: Rubye Oaks M.D.   On: 08/27/2015 01:11   Assessment/Plan 1. Pyelonephritis Patient presents with complaints of left lower quadrant pain. CT scan does not show any evidence of any GI issue. He does show left perinephric edema concerning for pyelonephritis as well as a left 0.6 cm centimeters cyst. With this the patient will be treated with ceftriaxone. We give her IV hydration. Monitor her on telemetry. Follow the cultures.  2.  DM (diabetes mellitus) Patient is unable to afford invokana, we'll consult case manager in the morning. Although this can  cause further recurrent UTI. We will place her on insulin sliding scale in the hospital. Holding metformin.  Nutrition: Diabetic diet DVT Prophylaxis: subcutaneous Heparin  Advance goals of care discussion: Full code   Disposition: Admitted as inpatient, telemetry unit.  Author: Lynden Oxford, MD [facility] Pager: [phone] 08/27/2015  If 7PM-7AM, please contact night-coverage [email or web address] Password [email or web address]

## 2015-08-27 NOTE — Progress Notes (Addendum)
ANTIBIOTIC CONSULT NOTE - INITIAL  Pharmacy Consult for Rocephin Indication: UTI  No Known Allergies   Labs:  Recent Labs  08/26/15 2322  WBC 15.2*  HGB 12.4  PLT 323  CREATININE 0.77     Assessment/Plan:  49yo female c/o urinary frequency x2wk despite tx w/ doxycycline, now w/ flank pain, chills, diaphoresis, myalgias, and nausea, UA abnormal, to begin IV ABX.  Will start Rocephin 1g IV Q24H and monitor CBC and Cx.  Vernard Gambles, PharmD, BCPS  08/27/2015,4:03 AM  ------------------------------------------------------------------------------------------------------------------------ Addendum:  Pharmacy will sign off of the protocol as no further dose adjustments are expected at this time.  Georgina Pillion, PharmD, BCPS Clinical Pharmacist Pager: [phone] 08/27/2015 2:19 PM

## 2015-08-27 NOTE — Progress Notes (Signed)
[facility]                                                                              Patient Demographics  Angel Bradshaw, is a 49 y.o. female, DOB - 25-Jan-1966, ZOX:[id number]  Admit date - 08/26/2015   Admitting Physician Angel Monica, MD  Outpatient [facility], F[facility]  LOS - 0   Chief Complaint  Patient presents with  . Fever       Brief HPI  The patient is a 49 year old female with diabetes mellitus presented with left-sided flank pain, increasing urinary frequency ongoing for the last 2 weeks. Patient also complained of fevers and chills with diaphoresis. Patient was found to have UTI. CT of the abdomen showed mild left perinephric edema with underlying pyelonephritis   Assessment & Plan    Principal Problem:   Pyelonephritis, UTI - Follow urine culture, blood cultures, continue IV Rocephin  Active Problems: Lactic acidosis, dehydration - Continue gentle hydration    DM (diabetes mellitus) - Obtain hemoglobin A1c, place on sliding scale insulin  Code Status: Full code  Family Communication: Discussed in detail with the patient, all imaging results, lab results explained to the patient    Disposition Plan: Not medically ready  Time Spent in minutes   25 minutes  Procedures  CT renal stone study  Consults   None  DVT Prophylaxis Lovenox  Medications  Scheduled Meds: . aspirin EC  81 mg Oral Daily  . cefTRIAXone (ROCEPHIN)  IV  1 g Intravenous Q24H  . enoxaparin (LOVENOX) injection  40 mg Subcutaneous Q24H  . [START ON 08/28/2015] Influenza vac split quadrivalent PF  0.5 mL Intramuscular Tomorrow-1000  . insulin aspart  0-15 Units Subcutaneous TID WC  . insulin aspart  0-5 Units Subcutaneous QHS  . sodium chloride  3 mL Intravenous Q12H   Continuous Infusions: . sodium chloride     PRN Meds:.acetaminophen **OR** acetaminophen, ondansetron **OR** ondansetron (ZOFRAN) IV,  oxyCODONE-acetaminophen   Antibiotics   Anti-infectives    Start     Dose/Rate Route Frequency Ordered Stop   08/27/15 0500  cefTRIAXone (ROCEPHIN) 1 g in dextrose 5 % 50 mL IVPB     1 g 100 mL/hr over 30 Minutes Intravenous Every 24 hours 08/27/15 0403     08/27/15 0025  piperacillin-tazobactam (ZOSYN) 3.375 GM/50ML IVPB    Comments:  Angel Bradshaw   : cabinet override      08/27/15 0025 08/27/15 0034   08/26/15 2345  piperacillin-tazobactam (ZOSYN) IVPB 4.5 g  Status:  Discontinued     4.5 g 200 mL/hr over 30 Minutes Intravenous  Once 08/26/15 2341 08/27/15 0358        Subjective:   Angel Bradshaw was seen and examined today. Still feeling chills, no fevers. Patient denies dizziness, chest pain, shortness of breath, new weakness, numbess, tingling. Left-sided flank pain with nausea.   Objective:   Blood pressure 105/68, pulse 91, temperature 98 F (36.7 C), temperature source Oral, resp. rate 18, weight 82.918 kg (182 lb 12.8 oz), SpO2 99 %.  Wt Readings from Last 3 Encounters:  08/27/15 82.918 kg (  182 lb 12.8 oz)  11/07/13 79.561 kg (175 lb 6.4 oz)  10/20/13 81.602 kg (179 lb 14.4 oz)     Intake/Output Summary (Last 24 hours) at 08/27/15 1306 Last data filed at 08/27/15 1134  Gross per 24 hour  Intake   2080 ml  Output   2150 ml  Net    -70 ml    Exam  General: Alert and oriented x 3, NAD  HEENT:  PERRLA, EOMI, Anicteric Sclera, mucous membranes moist.   Neck: Supple, no JVD, no masses  CVS: S1 S2 auscultated, no rubs, murmurs or gallops. Regular rate and rhythm.  Respiratory: Clear to auscultation bilaterally, no wheezing, rales or rhonchi  Abdomen: Soft, nontender, nondistended, + bowel sounds, + LT CVAT  Ext: no cyanosis clubbing or edema  Neuro: AAOx3, Cr N's II- XII. Strength 5/5 upper and lower extremities bilaterally  Skin: No rashes  Psych: Normal affect and demeanor, alert and oriented x3    Data Review   Micro Results Recent Results  (from the past 240 hour(s))  Blood culture (routine x 2)     Status: None (Preliminary result)   Collection Time: 08/26/15 11:22 PM  Result Value Ref Range Status   Specimen Description BLOOD RIGHT ARM  Final   Special Requests BOTTLES DRAWN AEROBIC AND ANAEROBIC  Final   Culture PENDING  Incomplete   Report Status PENDING  Incomplete  Blood culture (routine x 2)     Status: None (Preliminary result)   Collection Time: 08/26/15 11:58 PM  Result Value Ref Range Status   Specimen Description BLOOD LEFT HAND  Final   Special Requests BOTTLES DRAWN AEROBIC AND ANAEROBIC 5CC  Final   Culture PENDING  Incomplete   Report Status PENDING  Incomplete    Radiology Reports Ct Renal Stone Study  08/27/2015   CLINICAL DATA:  Left flank pain, onset today. Urinary frequency for 2 weeks.  EXAM: CT ABDOMEN AND PELVIS WITHOUT CONTRAST  TECHNIQUE: Multidetector CT imaging of the abdomen and pelvis was performed following the standard protocol without IV contrast.  COMPARISON:  CT 11/17/2013  FINDINGS: The included lung bases are clear.  No renal stones or urolithiasis. No hydronephrosis. There is mild asymmetric left perinephric edema. The left ureter is mildly prominent, however no stone throughout its course. No right perinephric edema.  Evaluation of the remaining solid and hollow viscera is limited given lack of contrast. The liver appears prominent in size measuring 22 cm in cranial caudal dimension. Clips in the gallbladder fossa postcholecystectomy. The unenhanced spleen, adrenal glands, and pancreas are normal.  Stomach is distended with ingested contents. There are no dilated or thickened bowel loops. Ingested contents throughout small bowel loops in the left abdomen. Small to moderate volume of colonic stool without colonic wall thickening. The appendix is normal. No free air, free fluid, or intra-abdominal fluid collection.  No retroperitoneal adenopathy. Abdominal aorta is normal in caliber. There is  small umbilical hernia.  Within the pelvis the bladder is physiologically distended, no bladder wall thickening. Uterus appears prominent in size. Probable 2.6 cm cyst in the left ovary. Right ovary not confidently seen. No pelvic free fluid.  There is degenerative disc disease at L5-S1. There are no acute or suspicious osseous abnormalities.  IMPRESSION: Mild left perinephric edema, may reflect underlying pyelonephritis. No urolithiasis or obstructive uropathy.   Electronically Signed   By: Rubye Oaks M.D.   On: 08/27/2015 01:11    CBC  Recent Labs Lab 08/26/15 2322 08/27/15  0536  WBC 15.2* 12.2*  HGB 12.4 10.7*  HCT 37.8 33.7*  PLT 323 279  MCV 79.9 80.4  MCH 26.2 25.5*  MCHC 32.8 31.8  RDW 14.4 14.4  LYMPHSABS 1.1  --   MONOABS 1.0  --   EOSABS 0.0  --   BASOSABS 0.0  --     Chemistries   Recent Labs Lab 08/26/15 2322 08/27/15 0536  NA 135 136  K 4.2 3.7  CL 102 108  CO2 24 21*  GLUCOSE 197* 162*  BUN 7 6  CREATININE 0.77 0.60  CALCIUM 9.0 7.8*  AST 16 13*  ALT 10* 9*  ALKPHOS 68 53  BILITOT 0.5 0.4   ------------------------------------------------------------------------------------------------------------------ CrCl cannot be calculated (Unknown ideal weight.). ------------------------------------------------------------------------------------------------------------------ No results for input(s): HGBA1C in the last 72 hours. ------------------------------------------------------------------------------------------------------------------ No results for input(s): CHOL, HDL, LDLCALC, TRIG, CHOLHDL, LDLDIRECT in the last 72 hours. ------------------------------------------------------------------------------------------------------------------ No results for input(s): TSH, T4TOTAL, T3FREE, THYROIDAB in the last 72 hours.  Invalid input(s):  FREET3 ------------------------------------------------------------------------------------------------------------------ No results for input(s): VITAMINB12, FOLATE, FERRITIN, TIBC, IRON, RETICCTPCT in the last 72 hours.  Coagulation profile No results for input(s): INR, PROTIME in the last 168 hours.  No results for input(s): DDIMER in the last 72 hours.  Cardiac Enzymes No results for input(s): CKMB, TROPONINI, MYOGLOBIN in the last 168 hours.  Invalid input(s): CK ------------------------------------------------------------------------------------------------------------------ Invalid input(s): POCBNP   Recent Labs  08/27/15 0052 08/27/15 0157 08/27/15 0802 08/27/15 1131  GLUCAP 191* 185* 162* 184*     RAI,RIPUDEEP M.D. [facility] 08/27/2015, 1:06 PM  Pager: [phone] Between 7am to 7pm - call Pager - [phone]  After 7pm go to [email or web address] - password [email or web address]  Call night coverage person covering after 7pm

## 2015-08-27 NOTE — Progress Notes (Signed)
New Admission Note:   Arrival Method: stretcher via ems Mental Orientation: alert and oriented x4 Telemetry:box 27 Assessment: Completed Skin:intact ZO:XWRU anticubital Pain:pta medicated Tubes:none Safety Measures: Safety Fall Prevention Plan has been discussed  Admission: Completed [address] Orientation: Patient has been orientated to the room, unit and staff.  Family:none present  Orders have been reviewed and implemented. Will continue to monitor the patient. Call light has been placed within reach and bed alarm has been activated.   Clement Sayres, RN Phone number: [phone]

## 2015-08-27 NOTE — Care Management Note (Signed)
Case Management Note  Patient Details  Name: Yariela Tison MRN: [id number] Date of Birth: 1966/08/20  Subjective/Objective:                 CM following for progression and d/c planning.   Action/Plan: Received consult for medication assistance, will clarify if pt is eligible for [facility] letter, noted that pt has PCP, Deneen Harts.  Will continue to follow.   Expected Discharge Date:  08/30/15               Expected Discharge Plan:  Home/Self Care  In-House Referral:  Clinical Social Work  Discharge planning Services  CM Consult, [facility] Program  Post Acute Care Choice:  NA Choice offered to:  NA  DME Arranged:    DME Agency:     HH Arranged:    HH Agency:     Status of Service:  In process, will continue to follow  Medicare Important Message Given:    Date Medicare IM Given:    Medicare IM give by:    Date Additional Medicare IM Given:    Additional Medicare Important Message give by:     If discussed at Long Length of Stay Meetings, dates discussed:    Additional Comments:  Starlyn Skeans, RN 08/27/2015, 10:10 AM

## 2015-08-28 DIAGNOSIS — A4151 Sepsis due to Escherichia coli [E. coli]: Secondary | ICD-10-CM | POA: Diagnosis present

## 2015-08-28 LAB — GLUCOSE, CAPILLARY
GLUCOSE-CAPILLARY: 175 mg/dL — AB (ref 65–99)
Glucose-Capillary: 136 mg/dL — ABNORMAL HIGH (ref 65–99)
Glucose-Capillary: 224 mg/dL — ABNORMAL HIGH (ref 65–99)
Glucose-Capillary: 147 mg/dL — ABNORMAL HIGH (ref 65–99)

## 2015-08-28 LAB — BASIC METABOLIC PANEL
Anion gap: 7 (ref 5–15)
CHLORIDE: 103 mmol/L (ref 101–111)
CO2: 22 mmol/L (ref 22–32)
CREATININE: 0.55 mg/dL (ref 0.44–1.00)
Calcium: 7.8 mg/dL — ABNORMAL LOW (ref 8.9–10.3)
GFR calc Af Amer: >60 mL/min (ref >60)
GFR calc non Af Amer: >60 mL/min (ref >60)
GLUCOSE: 156 mg/dL — AB (ref 65–99)
Potassium: 4 mmol/L (ref 3.5–5.1)
Sodium: 132 mmol/L — ABNORMAL LOW (ref 135–145)

## 2015-08-28 LAB — CBC
HEMATOCRIT: 32.8 % — AB (ref 36.0–46.0)
HEMOGLOBIN: 10.3 g/dL — AB (ref 12.0–15.0)
MCH: 25.4 pg — AB (ref 26.0–34.0)
MCHC: 31.4 g/dL (ref 30.0–36.0)
MCV: 80.8 fL (ref 78.0–100.0)
Platelets: 224 10*3/uL (ref 150–400)
RBC: 4.06 MIL/uL (ref 3.87–5.11)
RDW: 14.4 % (ref 11.5–15.5)
WBC: 10.2 10*3/uL (ref 4.0–10.5)

## 2015-08-28 LAB — HEMOGLOBIN A1C
Hgb A1c MFr Bld: 7.3 % — ABNORMAL HIGH (ref 4.8–5.6)
Mean Plasma Glucose: 163 mg/dL

## 2015-08-28 MED ORDER — CEFTRIAXONE SODIUM 2 G IJ SOLR
2.0000 g | INTRAMUSCULAR | Status: DC
  Filled 2015-08-28: qty 2

## 2015-08-28 MED ORDER — DEXTROSE 5 % IV SOLN
1.0000 g | Freq: Once | INTRAVENOUS | Status: AC
  Administered 2015-08-28: 1 g via INTRAVENOUS
  Filled 2015-08-28: qty 10

## 2015-08-28 NOTE — Progress Notes (Signed)
[facility]                                                                              Patient Demographics  Angel Bradshaw, is a 49 y.o. female, DOB - 1965/12/21, ZOX:[id number]  Admit date - 08/26/2015   Admitting Physician Angel Monica, MD  Outpatient Primary MD for the patient is [facility], FNP  LOS - 1   Chief Complaint  Patient presents with  . Fever       Brief HPI  The patient is a 49 year old female with diabetes mellitus presented with left-sided flank pain, increasing urinary frequency ongoing for the last 2 weeks. Patient also complained of fevers and chills with diaphoresis. Patient was found to have UTI. CT of the abdomen showed mild left perinephric edema with underlying pyelonephritis   Assessment & Plan    Principal Problem:   Pyelonephritis, UTI, gram-negative/Escherichia coli bacteremia - Follow urine culture,  - blood cultures showing gram-negative rods/Escherichia coli, final sensitivities pending - Continue IV Rocephin, increased dose, will need oral antibiotics pending sensitivities for 2 weeks   Active Problems: Lactic acidosis, dehydration - Improved, patient tolerating oral diet, will DC IV fluids    DM (diabetes mellitus) - Hemoglobin A1c 7.3. Continue sliding scale insulin  Code Status: Full code  Family Communication: Discussed in detail with the patient, all imaging results, lab results explained to the patient    Disposition Plan: Not medically ready  Time Spent in minutes   25 minutes  Procedures  CT renal stone study  Consults   None  DVT Prophylaxis Lovenox  Medications  Scheduled Meds: . aspirin EC  81 mg Oral Daily  . [START ON 08/29/2015] cefTRIAXone (ROCEPHIN)  IV  2 g Intravenous Q24H  . enoxaparin (LOVENOX) injection  40 mg Subcutaneous Q24H  . Influenza vac split quadrivalent PF  0.5 mL Intramuscular Tomorrow-1000  . insulin aspart  0-15 Units Subcutaneous TID WC  . insulin aspart   0-5 Units Subcutaneous QHS  . sodium chloride  3 mL Intravenous Q12H   Continuous Infusions:   PRN Meds:.acetaminophen **OR** acetaminophen, ondansetron **OR** ondansetron (ZOFRAN) IV, oxyCODONE-acetaminophen   Antibiotics   Anti-infectives    Start     Dose/Rate Route Frequency Ordered Stop   08/29/15 1200  cefTRIAXone (ROCEPHIN) 2 g in dextrose 5 % 50 mL IVPB     2 g 100 mL/hr over 30 Minutes Intravenous Every 24 hours 08/28/15 0938     08/28/15 1000  cefTRIAXone (ROCEPHIN) 1 g in dextrose 5 % 50 mL IVPB     1 g 100 mL/hr over 30 Minutes Intravenous  Once 08/28/15 0939 08/28/15 1059   08/27/15 0500  cefTRIAXone (ROCEPHIN) 1 g in dextrose 5 % 50 mL IVPB  Status:  Discontinued     1 g 100 mL/hr over 30 Minutes Intravenous Every 24 hours 08/27/15 0403 08/28/15 0938   08/27/15 0025  piperacillin-tazobactam (ZOSYN) 3.375 GM/50ML IVPB    Comments:  Angel Bradshaw   : cabinet override      08/27/15 0025 08/27/15 0034   08/26/15 2345  piperacillin-tazobactam (ZOSYN) IVPB 4.5 g  Status:  Discontinued  4.5 g 200 mL/hr over 30 Minutes Intravenous  Once 08/26/15 2341 08/27/15 0358        Subjective:   Angel Bradshaw was seen and examined today. Feels a lot better today still having left-sided flank pain otherwise no chills, nausea or vomiting. Patient denies dizziness, chest pain, shortness of breath, new weakness, numbess, tingling.   Objective:   Blood pressure 121/75, pulse 89, temperature 98.6 F (37 C), temperature source Oral, resp. rate 18, weight 82.464 kg (181 lb 12.8 oz), SpO2 98 %.  Wt Readings from Last 3 Encounters:  08/27/15 82.464 kg (181 lb 12.8 oz)  11/07/13 79.561 kg (175 lb 6.4 oz)  10/20/13 81.602 kg (179 lb 14.4 oz)     Intake/Output Summary (Last 24 hours) at 08/28/15 1250 Last data filed at 08/28/15 1203  Gross per 24 hour  Intake 2125.42 ml  Output   5500 ml  Net -3374.58 ml    Exam  General: Alert and oriented x 3, NAD  HEENT:  PERRLA,  EOMI, Anicteric Sclera, mucous membranes moist.   Neck: Supple, no JVD, no masses  CVS: S1 S2 clear, RRR  Respiratory: CTAB  Abdomen: Soft, nontender, nondistended, + bowel sounds, + LT CVAT  Ext: no cyanosis clubbing or edema  Neuro: no new deficits  Skin: No rashes  Psych: Normal affect and demeanor, alert and oriented x3    Data Review   Micro Results Recent Results (from the past 240 hour(s))  Blood culture (routine x 2)     Status: None (Preliminary result)   Collection Time: 08/26/15 11:22 PM  Result Value Ref Range Status   Specimen Description BLOOD RIGHT ARM  Final   Special Requests BOTTLES DRAWN AEROBIC AND ANAEROBIC  Final   Culture  Setup Time   Final    GRAM NEGATIVE RODS IN BOTH AEROBIC AND ANAEROBIC BOTTLES CRITICAL RESULT CALLED TO, READ BACK BY AND VERIFIED WITH: H STREET RN 1859 08/27/15 A BROWNING    Culture   Final    ESCHERICHIA COLI Performed at [facility]    Report Status PENDING  Incomplete  Blood culture (routine x 2)     Status: None (Preliminary result)   Collection Time: 08/26/15 11:58 PM  Result Value Ref Range Status   Specimen Description BLOOD LEFT HAND  Final   Special Requests BOTTLES DRAWN AEROBIC AND ANAEROBIC 5CC  Final   Culture  Setup Time   Final    GRAM NEGATIVE RODS IN BOTH AEROBIC AND ANAEROBIC BOTTLES CRITICAL RESULT CALLED TO, READ BACK BY AND VERIFIED WITH: H STREET RN 1859 08/27/15 A BROWNING    Culture   Final    ESCHERICHIA COLI Performed at [facility]    Report Status PENDING  Incomplete    Radiology Reports Ct Renal Stone Study  08/27/2015   CLINICAL DATA:  Left flank pain, onset today. Urinary frequency for 2 weeks.  EXAM: CT ABDOMEN AND PELVIS WITHOUT CONTRAST  TECHNIQUE: Multidetector CT imaging of the abdomen and pelvis was performed following the standard protocol without IV contrast.  COMPARISON:  CT 11/17/2013  FINDINGS: The included lung bases are clear.  No renal stones or  urolithiasis. No hydronephrosis. There is mild asymmetric left perinephric edema. The left ureter is mildly prominent, however no stone throughout its course. No right perinephric edema.  Evaluation of the remaining solid and hollow viscera is limited given lack of contrast. The liver appears prominent in size measuring 22 cm in cranial caudal dimension. Clips  in the gallbladder fossa postcholecystectomy. The unenhanced spleen, adrenal glands, and pancreas are normal.  Stomach is distended with ingested contents. There are no dilated or thickened bowel loops. Ingested contents throughout small bowel loops in the left abdomen. Small to moderate volume of colonic stool without colonic wall thickening. The appendix is normal. No free air, free fluid, or intra-abdominal fluid collection.  No retroperitoneal adenopathy. Abdominal aorta is normal in caliber. There is small umbilical hernia.  Within the pelvis the bladder is physiologically distended, no bladder wall thickening. Uterus appears prominent in size. Probable 2.6 cm cyst in the left ovary. Right ovary not confidently seen. No pelvic free fluid.  There is degenerative disc disease at L5-S1. There are no acute or suspicious osseous abnormalities.  IMPRESSION: Mild left perinephric edema, may reflect underlying pyelonephritis. No urolithiasis or obstructive uropathy.   Electronically Signed   By: Rubye Oaks M.D.   On: 08/27/2015 01:11    CBC  Recent Labs Lab 08/26/15 2322 08/27/15 0536 08/28/15 0350  WBC 15.2* 12.2* 10.2  HGB 12.4 10.7* 10.3*  HCT 37.8 33.7* 32.8*  PLT 323 279 224  MCV 79.9 80.4 80.8  MCH 26.2 25.5* 25.4*  MCHC 32.8 31.8 31.4  RDW 14.4 14.4 14.4  LYMPHSABS 1.1  --   --   MONOABS 1.0  --   --   EOSABS 0.0  --   --   BASOSABS 0.0  --   --     Chemistries   Recent Labs Lab 08/26/15 2322 08/27/15 0536 08/28/15 0350  NA 135 136 132*  K 4.2 3.7 4.0  CL 102 108 103  CO2 24 21* 22  GLUCOSE 197* 162* 156*  BUN 7 6  <5*  CREATININE 0.77 0.60 0.55  CALCIUM 9.0 7.8* 7.8*  AST 16 13*  --   ALT 10* 9*  --   ALKPHOS 68 53  --   BILITOT 0.5 0.4  --    ------------------------------------------------------------------------------------------------------------------ CrCl cannot be calculated (Unknown ideal weight.). ------------------------------------------------------------------------------------------------------------------  Recent Labs  08/27/15 0536  HGBA1C 7.3*   ------------------------------------------------------------------------------------------------------------------ No results for input(s): CHOL, HDL, LDLCALC, TRIG, CHOLHDL, LDLDIRECT in the last 72 hours. ------------------------------------------------------------------------------------------------------------------ No results for input(s): TSH, T4TOTAL, T3FREE, THYROIDAB in the last 72 hours.  Invalid input(s): FREET3 ------------------------------------------------------------------------------------------------------------------ No results for input(s): VITAMINB12, FOLATE, FERRITIN, TIBC, IRON, RETICCTPCT in the last 72 hours.  Coagulation profile No results for input(s): INR, PROTIME in the last 168 hours.  No results for input(s): DDIMER in the last 72 hours.  Cardiac Enzymes No results for input(s): CKMB, TROPONINI, MYOGLOBIN in the last 168 hours.  Invalid input(s): CK ------------------------------------------------------------------------------------------------------------------ Invalid input(s): POCBNP   Recent Labs  08/27/15 0802 08/27/15 1131 08/27/15 1653 08/27/15 2024 08/28/15 0801 08/28/15 1140  GLUCAP 162* 184* 155* 179* 136* 175*     RAI,RIPUDEEP M.D. [facility] 08/28/2015, 12:50 PM  Pager: [phone] Between 7am to 7pm - call Pager - [phone]  After 7pm go to [email or web address] - password [email or web address]  Call night coverage person covering after 7pm

## 2015-08-29 LAB — CBC
HEMATOCRIT: 30.8 % — AB (ref 36.0–46.0)
HEMOGLOBIN: 10.1 g/dL — AB (ref 12.0–15.0)
MCH: 26.2 pg (ref 26.0–34.0)
MCHC: 32.8 g/dL (ref 30.0–36.0)
MCV: 79.8 fL (ref 78.0–100.0)
Platelets: 223 10*3/uL (ref 150–400)
RBC: 3.86 MIL/uL — ABNORMAL LOW (ref 3.87–5.11)
RDW: 14.2 % (ref 11.5–15.5)
WBC: 6.5 10*3/uL (ref 4.0–10.5)

## 2015-08-29 LAB — BASIC METABOLIC PANEL
Anion gap: 8 (ref 5–15)
CALCIUM: 8.3 mg/dL — AB (ref 8.9–10.3)
CHLORIDE: 104 mmol/L (ref 101–111)
CO2: 24 mmol/L (ref 22–32)
Creatinine, Ser: 0.51 mg/dL (ref 0.44–1.00)
GFR calc Af Amer: >60 mL/min (ref >60)
GFR calc non Af Amer: >60 mL/min (ref >60)
GLUCOSE: 148 mg/dL — AB (ref 65–99)
Potassium: 3.8 mmol/L (ref 3.5–5.1)
Sodium: 136 mmol/L (ref 135–145)

## 2015-08-29 LAB — CULTURE, BLOOD (ROUTINE X 2)

## 2015-08-29 LAB — URINE CULTURE

## 2015-08-29 LAB — GLUCOSE, CAPILLARY: GLUCOSE-CAPILLARY: 140 mg/dL — AB (ref 65–99)

## 2015-08-29 LAB — LACTIC ACID, PLASMA: Lactic Acid, Venous: 0.9 mmol/L (ref 0.5–2.0)

## 2015-08-29 MED ORDER — ASPIRIN-ACETAMINOPHEN-CAFFEINE 250-250-65 MG PO TABS: 1.0000 | ORAL_TABLET | Freq: Three times a day (TID) | ORAL | Status: DC | PRN

## 2015-08-29 MED ORDER — CIPROFLOXACIN HCL 500 MG PO TABS: 500.0000 mg | ORAL_TABLET | Freq: Two times a day (BID) | ORAL | Status: DC

## 2015-08-29 MED ORDER — CIPROFLOXACIN HCL 500 MG PO TABS
500.0000 mg | ORAL_TABLET | Freq: Two times a day (BID) | ORAL | Status: DC
  Administered 2015-08-29: 500 mg via ORAL
  Filled 2015-08-29: qty 1

## 2015-08-29 MED ORDER — PROMETHAZINE HCL 25 MG PO TABS: 25.0000 mg | ORAL_TABLET | Freq: Four times a day (QID) | ORAL | Status: DC | PRN

## 2015-08-29 MED ORDER — OXYCODONE-ACETAMINOPHEN 5-325 MG PO TABS: 1.0000 | ORAL_TABLET | Freq: Four times a day (QID) | ORAL | Status: DC | PRN

## 2015-08-29 NOTE — Progress Notes (Signed)
Tylenol 650 mg given for temperature of 100.1. Temperature an hour after tylenol equals 98.4.

## 2015-08-29 NOTE — Progress Notes (Signed)
Utilization Review Completed.Angel Bradshaw T9/30/2016  

## 2015-08-29 NOTE — Discharge Summary (Signed)
Physician Discharge Summary   Patient ID: Angel Bradshaw MRN: [id number] DOB/AGE: 01-18-1966 49 y.o.  Admit date: 08/26/2015 Discharge date: 08/29/2015  Primary Care Physician:  [facility], FNP  Discharge Diagnoses:     Escherichia coli bacteremia/sepsis  . Pyelonephritis . Escherichia coli UTI   diabetes mellitus  Consults:  None   Recommendations for Outpatient Follow-up:  Continue ciprofloxacin 500 mg twice a day for 14 days  TESTS THAT NEED FOLLOW-UP None   DIET: Carb modified diet    Allergies:  No Known Allergies   Discharge Medications:   Medication List    TAKE these medications        aspirin EC 81 MG tablet  Take 81 mg by mouth daily.     aspirin-acetaminophen-caffeine 250-250-65 MG tablet  Commonly known as:  EXCEDRIN MIGRAINE  Take 1 tablet by mouth every 8 (eight) hours as needed for headache or migraine (available over the counter).     ciprofloxacin 500 MG tablet  Commonly known as:  CIPRO  Take 1 tablet (500 mg total) by mouth 2 (two) times daily. X 2 weeks     fish oil-omega-3 fatty acids 1000 MG capsule  Take 1 g by mouth daily.     ibuprofen 600 MG tablet  Commonly known as:  ADVIL,MOTRIN  Take 1 tablet (600 mg total) by mouth every 6 (six) hours as needed.     lisinopril 20 MG tablet  Commonly known as:  PRINIVIL,ZESTRIL  Take 20 mg by mouth daily.     metFORMIN 1000 MG tablet  Commonly known as:  GLUCOPHAGE  Take 1,000 mg by mouth 2 (two) times daily with a meal.     oxyCODONE-acetaminophen 5-325 MG tablet  Commonly known as:  PERCOCET/ROXICET  Take 1 tablet by mouth every 6 (six) hours as needed for severe pain.     promethazine 25 MG tablet  Commonly known as:  PHENERGAN  Take 1 tablet (25 mg total) by mouth every 6 (six) hours as needed for nausea or vomiting.         Brief H and P: For complete details please refer to admission H and P, but in brief The patient is a 49 year old female with diabetes mellitus  presented with left-sided flank pain, increasing urinary frequency ongoing for the last 2 weeks. Patient also complained of fevers and chills with diaphoresis. Patient was found to have UTI. CT of the abdomen showed mild left perinephric edema with underlying pyelonephritis   Hospital Course:  Pyelonephritis, Escherichia coli UTI /Escherichia coli bacteremia Patient was admitted for further workup. Urine culture showed Escherichia coli, unfortunately blood cultures also came back positive for Escherichia coli bacteremia. Patient was placed on IV Rocephin during hospitalization. Per sensitivities, transitioned to oral ciprofloxacin for 2 weeks.  Lactic acidosis, dehydration - Improved, patient tolerating oral diet, IV fluids were discontinued   DM (diabetes mellitus) - Hemoglobin A1c 7.3. Patient was placed on sliding scale insulin while inpatient. She can resume her metformin as she is tolerating oral diet without any difficulty    Day of Discharge BP 132/74 mmHg  Pulse 85  Temp(Src) 98.4 F (36.9 C) (Oral)  Resp 16  Wt 82.5 kg (181 lb 14.1 oz)  SpO2 100%  Physical Exam: General: Alert and awake oriented x3 not in any acute distress. HEENT: anicteric sclera, pupils reactive to light and accommodation CVS: S1-S2 clear no murmur rubs or gallops Chest: clear to auscultation bilaterally, no wheezing rales or rhonchi Abdomen: soft nontender, nondistended, normal bowel sounds  Extremities: no cyanosis, clubbing or edema noted bilaterally Neuro: Cranial nerves II-XII intact, no focal neurological deficits   The results of significant diagnostics from this hospitalization (including imaging, microbiology, ancillary and laboratory) are listed below for reference.    LAB RESULTS: Basic Metabolic Panel:  Recent Labs Lab 08/28/15 0350 08/29/15 0413  NA 132* 136  K 4.0 3.8  CL 103 104  CO2 22 24  GLUCOSE 156* 148*  BUN <5* <5*  CREATININE 0.55 0.51  CALCIUM 7.8* 8.3*   Liver  Function Tests:  Recent Labs Lab 08/26/15 2322 08/27/15 0536  AST 16 13*  ALT 10* 9*  ALKPHOS 68 53  BILITOT 0.5 0.4  PROT 7.5 5.5*  ALBUMIN 4.2 3.0*   No results for input(s): LIPASE, AMYLASE in the last 168 hours. No results for input(s): AMMONIA in the last 168 hours. CBC:  Recent Labs Lab 08/26/15 2322  08/28/15 0350 08/29/15 0413  WBC 15.2*  < > 10.2 6.5  NEUTROABS 13.0*  --   --   --   HGB 12.4  < > 10.3* 10.1*  HCT 37.8  < > 32.8* 30.8*  MCV 79.9  < > 80.8 79.8  PLT 323  < > 224 223  < > = values in this interval not displayed. Cardiac Enzymes: No results for input(s): CKTOTAL, CKMB, CKMBINDEX, TROPONINI in the last 168 hours. BNP: Invalid input(s): POCBNP CBG:  Recent Labs Lab 08/28/15 2051 08/29/15 0755  GLUCAP 147* 140*    Significant Diagnostic Studies:  Ct Renal Stone Study  08/27/2015   CLINICAL DATA:  Left flank pain, onset today. Urinary frequency for 2 weeks.  EXAM: CT ABDOMEN AND PELVIS WITHOUT CONTRAST  TECHNIQUE: Multidetector CT imaging of the abdomen and pelvis was performed following the standard protocol without IV contrast.  COMPARISON:  CT 11/17/2013  FINDINGS: The included lung bases are clear.  No renal stones or urolithiasis. No hydronephrosis. There is mild asymmetric left perinephric edema. The left ureter is mildly prominent, however no stone throughout its course. No right perinephric edema.  Evaluation of the remaining solid and hollow viscera is limited given lack of contrast. The liver appears prominent in size measuring 22 cm in cranial caudal dimension. Clips in the gallbladder fossa postcholecystectomy. The unenhanced spleen, adrenal glands, and pancreas are normal.  Stomach is distended with ingested contents. There are no dilated or thickened bowel loops. Ingested contents throughout small bowel loops in the left abdomen. Small to moderate volume of colonic stool without colonic wall thickening. The appendix is normal. No free air,  free fluid, or intra-abdominal fluid collection.  No retroperitoneal adenopathy. Abdominal aorta is normal in caliber. There is small umbilical hernia.  Within the pelvis the bladder is physiologically distended, no bladder wall thickening. Uterus appears prominent in size. Probable 2.6 cm cyst in the left ovary. Right ovary not confidently seen. No pelvic free fluid.  There is degenerative disc disease at L5-S1. There are no acute or suspicious osseous abnormalities.  IMPRESSION: Mild left perinephric edema, may reflect underlying pyelonephritis. No urolithiasis or obstructive uropathy.   Electronically Signed   By: Rubye Oaks M.D.   On: 08/27/2015 01:11    2D ECHO:   Disposition and Follow-up:     Discharge Instructions    Diet Carb Modified    Complete by:  As directed      Discharge instructions    Complete by:  As directed   Please continue ciprofloxacin for 2 weeks.  Excedrin is available over  the counter for migraine headaches.     Increase activity slowly    Complete by:  As directed             DISPOSITION: Home   DISCHARGE FOLLOW-UP Follow-up Information    Follow up with [facility]    . Schedule an appointment as soon as possible for a visit in 2 weeks.   Why:  for hospital follow-up   Contact information:   [address] [place] [address] [phone]      Follow up with [facility].   Why:  Hospital followup appointment  Friday, September 05, 2015 at 10:30am.   Contact information:   [address] [place] [address] [phone]       Time spent on Discharge: 25 mins   Signed:   RAI,RIPUDEEP M.D. [facility] 08/29/2015, 11:46 AM Pager: [phone]

## 2015-09-05 ENCOUNTER — Ambulatory Visit: Payer: Self-pay | Admitting: Family Medicine

## 2015-09-26 ENCOUNTER — Ambulatory Visit: Payer: Self-pay | Admitting: Family Medicine

## 2016-02-28 IMAGING — CT CT RENAL STONE PROTOCOL
2 of 4 series · 16 of 46 positions shown, 18 images · non-contrast
Comparison: CT 11/17/2013

CLINICAL DATA: Left flank pain, onset today. Urinary frequency for
2 weeks.

EXAM:
CT ABDOMEN AND PELVIS WITHOUT CONTRAST
TECHNIQUE: Multidetector CT imaging of the abdomen and pelvis was performed
following the standard protocol without IV contrast.

[Series 2: renal stone > 200 lbs 5.0 b31f · axial · 0.75mm/px · z∈[-574,-154]mm · 13 of 92 slices shown, 15 images]
[im 4/92  soft-tissue]
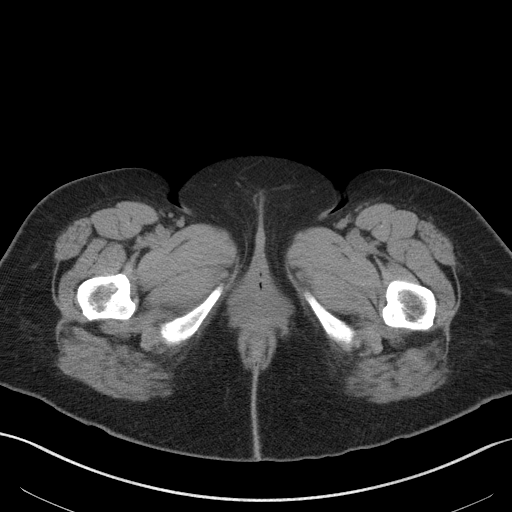
[im 4/92  bone]
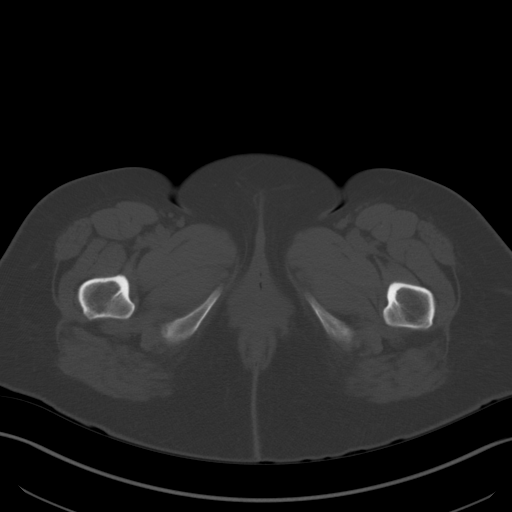
[im 12/92  soft-tissue]
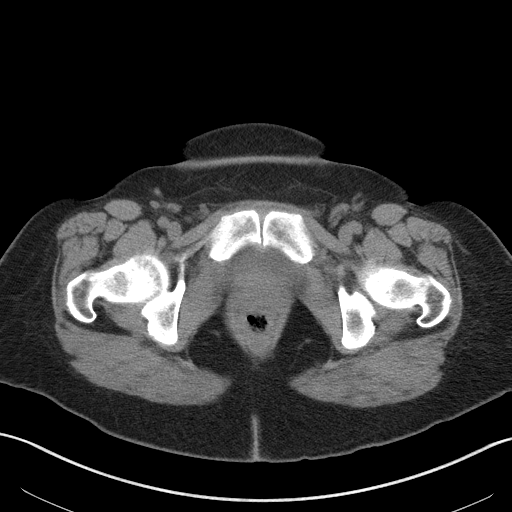
[im 20/92  soft-tissue]
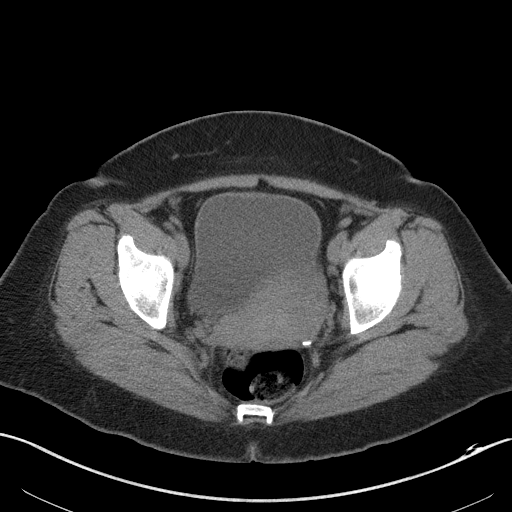
[im 24/92  soft-tissue]
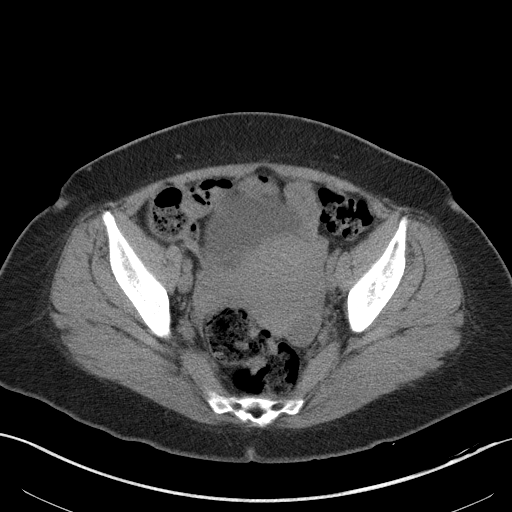
[im 32/92  soft-tissue]
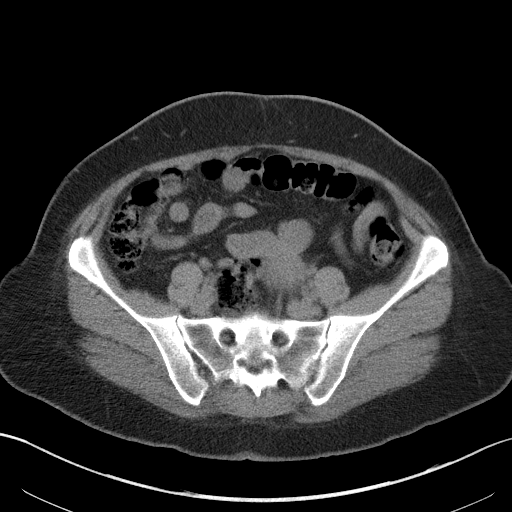
[im 40/92  soft-tissue]
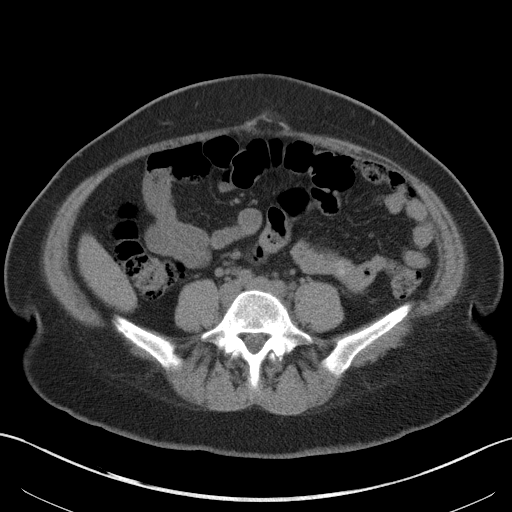
[im 48/92  soft-tissue]
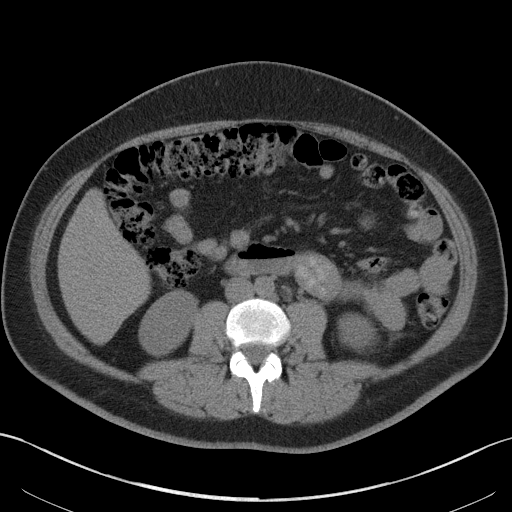
[im 52/92  soft-tissue]
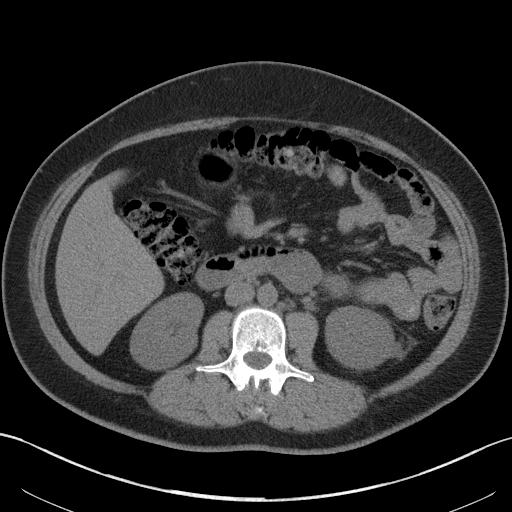
[im 60/92  soft-tissue]
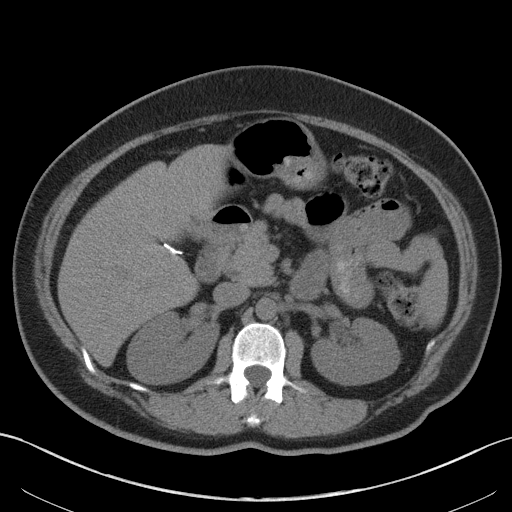
[im 60/92  bone]
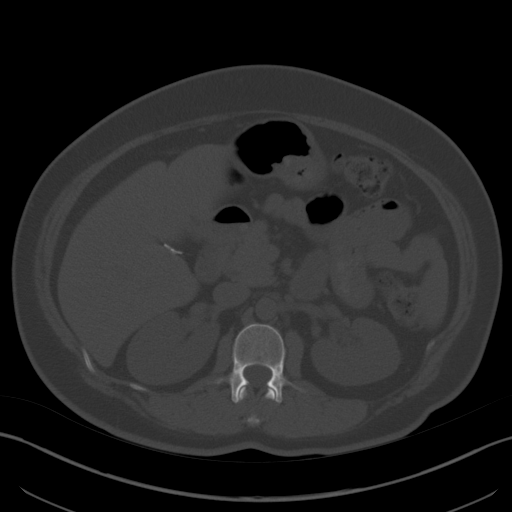
[im 68/92  soft-tissue]
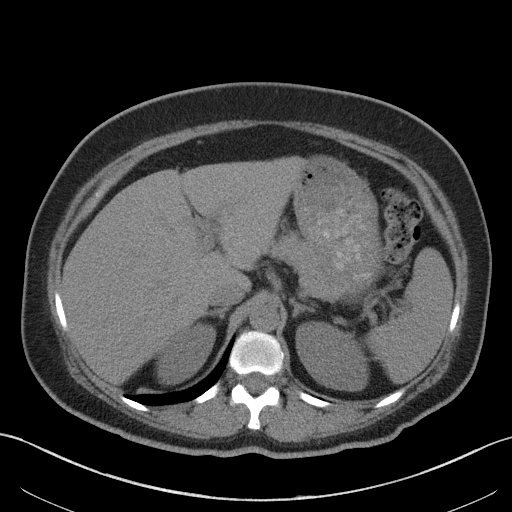
[im 72/92  soft-tissue]
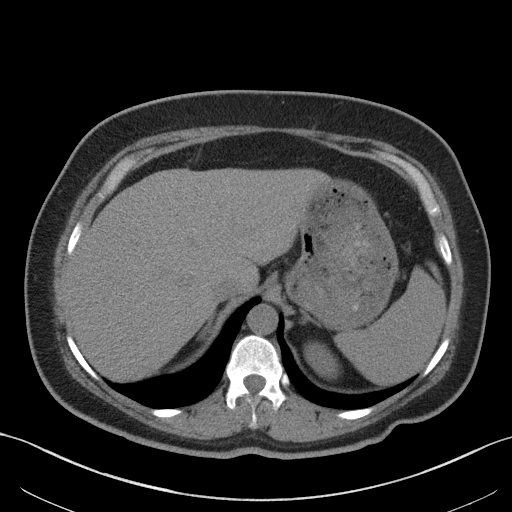
[im 80/92  soft-tissue]
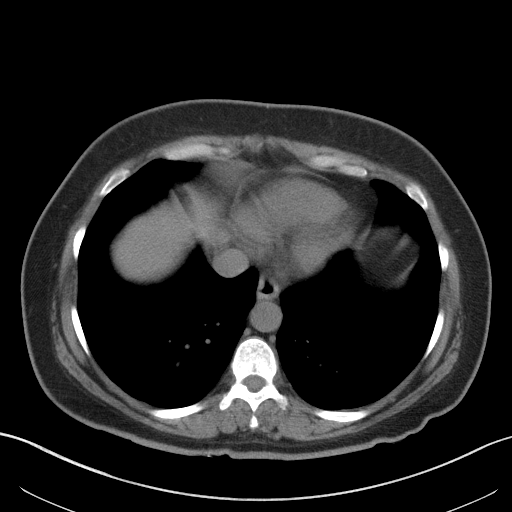
[im 88/92  soft-tissue]
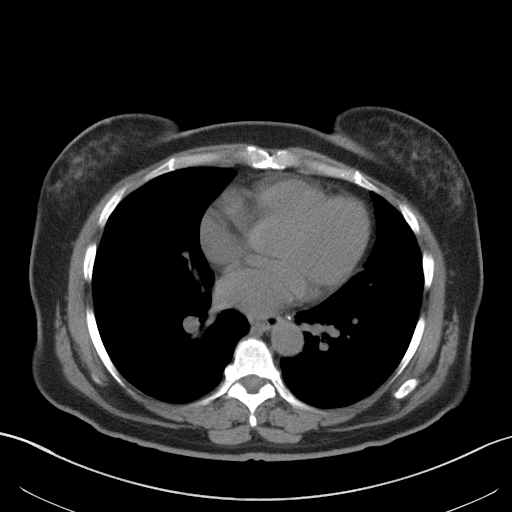

[Series 5: renal stone 3.0 coronal · coronal · 0.73mm/px · 3 of 99 slices shown]
[im 33/99  soft-tissue]
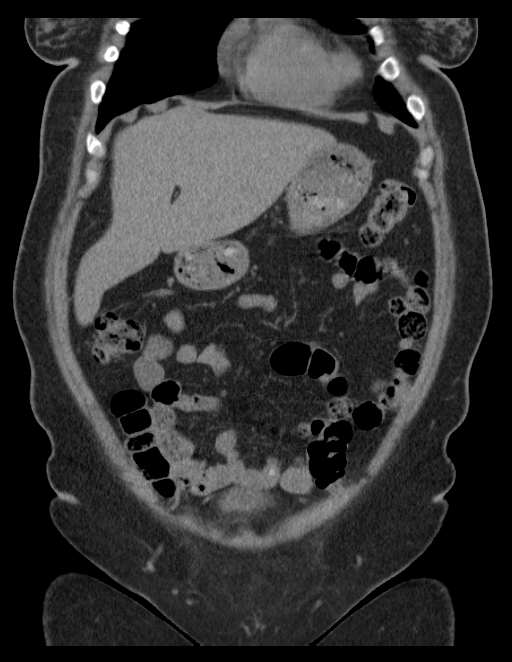
[im 44/99  soft-tissue]
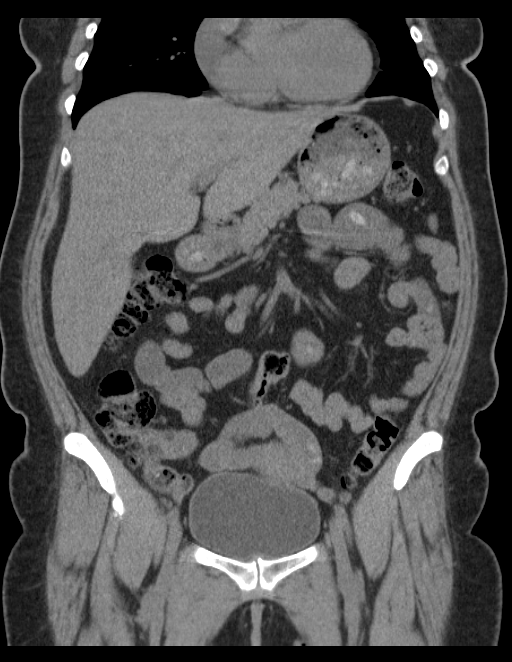
[im 55/99  soft-tissue]
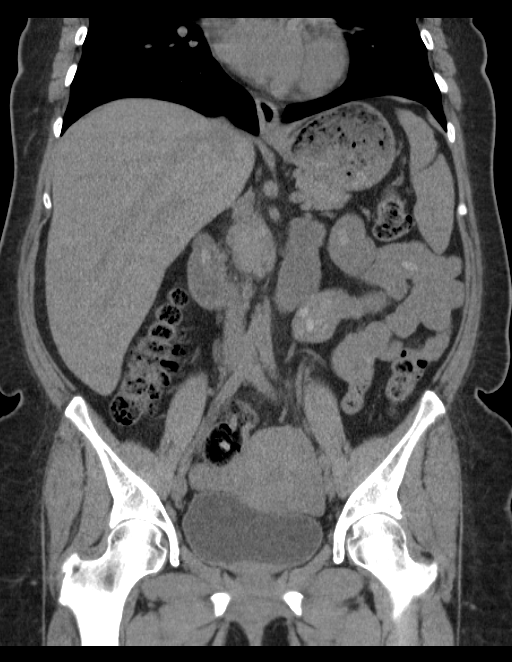

[16 of 46 positions shown; findings below may reference images not displayed]

FINDINGS: The included lung bases are clear.

No renal stones or urolithiasis. No hydronephrosis. There is mild
asymmetric left perinephric edema. The left ureter is mildly
prominent, however no stone throughout its course. No right
perinephric edema.

Evaluation of the remaining solid and hollow viscera is limited
given lack of contrast. The liver appears prominent in size
measuring 22 cm in cranial caudal dimension. Clips in the
gallbladder fossa postcholecystectomy. The unenhanced spleen,
adrenal glands, and pancreas are normal.

Stomach is distended with ingested contents. There are no dilated or
thickened bowel loops. Ingested contents throughout small bowel
loops in the left abdomen. Small to moderate volume of colonic stool
without colonic wall thickening. The appendix is normal. No free
air, free fluid, or intra-abdominal fluid collection.

No retroperitoneal adenopathy. Abdominal aorta is normal in caliber.
There is small umbilical hernia.

Within the pelvis the bladder is physiologically distended, no
bladder wall thickening. Uterus appears prominent in size. Probable
2.6 cm cyst in the left ovary. Right ovary not confidently seen. No
pelvic free fluid.

There is degenerative disc disease at L5-S1. There are no acute or
suspicious osseous abnormalities.
IMPRESSION: Mild left perinephric edema, may reflect underlying pyelonephritis.
No urolithiasis or obstructive uropathy.

## 2017-12-20 ENCOUNTER — Emergency Department (HOSPITAL_BASED_OUTPATIENT_CLINIC_OR_DEPARTMENT_OTHER): Payer: Self-pay

## 2017-12-20 ENCOUNTER — Encounter (HOSPITAL_BASED_OUTPATIENT_CLINIC_OR_DEPARTMENT_OTHER): Payer: Self-pay

## 2017-12-20 ENCOUNTER — Other Ambulatory Visit: Payer: Self-pay

## 2017-12-20 ENCOUNTER — Emergency Department (HOSPITAL_BASED_OUTPATIENT_CLINIC_OR_DEPARTMENT_OTHER)
Admission: EM | Admit: 2017-12-20 | Discharge: 2017-12-20 | Disposition: A | Discharge status: Home / Self Care | Payer: Self-pay | Attending: Emergency Medicine | Admitting: Emergency Medicine

## 2017-12-20 DIAGNOSIS — Z7982 Long term (current) use of aspirin: Secondary | ICD-10-CM | POA: Insufficient documentation

## 2017-12-20 DIAGNOSIS — E119 Type 2 diabetes mellitus without complications: Secondary | ICD-10-CM | POA: Insufficient documentation

## 2017-12-20 DIAGNOSIS — T8384XA Pain from genitourinary prosthetic devices, implants and grafts, initial encounter: Secondary | ICD-10-CM | POA: Insufficient documentation

## 2017-12-20 DIAGNOSIS — Y69 Unspecified misadventure during surgical and medical care: Secondary | ICD-10-CM | POA: Insufficient documentation

## 2017-12-20 DIAGNOSIS — Z7984 Long term (current) use of oral hypoglycemic drugs: Secondary | ICD-10-CM | POA: Insufficient documentation

## 2017-12-20 DIAGNOSIS — N23 Unspecified renal colic: Secondary | ICD-10-CM

## 2017-12-20 DIAGNOSIS — Z87442 Personal history of urinary calculi: Secondary | ICD-10-CM | POA: Insufficient documentation

## 2017-12-20 DIAGNOSIS — R300 Dysuria: Secondary | ICD-10-CM | POA: Insufficient documentation

## 2017-12-20 HISTORY — DX: Calculus of kidney: N20.0

## 2017-12-20 LAB — COMPREHENSIVE METABOLIC PANEL
ALK PHOS: 58 U/L (ref 38–126)
ALT: 20 U/L (ref 14–54)
AST: 26 U/L (ref 15–41)
Albumin: 4.7 g/dL (ref 3.5–5.0)
Anion gap: 12 (ref 5–15)
BILIRUBIN TOTAL: 0.4 mg/dL (ref 0.3–1.2)
BUN: 10 mg/dL (ref 6–20)
CALCIUM: 9.6 mg/dL (ref 8.9–10.3)
CO2: 22 mmol/L (ref 22–32)
CREATININE: 0.47 mg/dL (ref 0.44–1.00)
Chloride: 101 mmol/L (ref 101–111)
Glucose, Bld: 170 mg/dL — ABNORMAL HIGH (ref 65–99)
Potassium: 4.2 mmol/L (ref 3.5–5.1)
Sodium: 135 mmol/L (ref 135–145)
Total Protein: 8.7 g/dL — ABNORMAL HIGH (ref 6.5–8.1)

## 2017-12-20 LAB — CBC WITH DIFFERENTIAL/PLATELET
BASOS ABS: 0 10*3/uL (ref 0.0–0.1)
Basophils Relative: 0 %
Eosinophils Absolute: 0 10*3/uL (ref 0.0–0.7)
Eosinophils Relative: 0 %
HEMATOCRIT: 43.3 % (ref 36.0–46.0)
HEMOGLOBIN: 14.5 g/dL (ref 12.0–15.0)
LYMPHS ABS: 2.7 10*3/uL (ref 0.7–4.0)
LYMPHS PCT: 19 %
MCH: 27 pg (ref 26.0–34.0)
MCHC: 33.5 g/dL (ref 30.0–36.0)
MCV: 80.6 fL (ref 78.0–100.0)
Monocytes Absolute: 0.9 10*3/uL (ref 0.1–1.0)
Monocytes Relative: 7 %
NEUTROS ABS: 10.3 10*3/uL — AB (ref 1.7–7.7)
Neutrophils Relative %: 74 %
Platelets: 432 10*3/uL — ABNORMAL HIGH (ref 150–400)
RBC: 5.37 MIL/uL — AB (ref 3.87–5.11)
RDW: 13.8 % (ref 11.5–15.5)
WBC: 14 10*3/uL — AB (ref 4.0–10.5)

## 2017-12-20 LAB — I-STAT CG4 LACTIC ACID, ED
LACTIC ACID, VENOUS: 3.57 mmol/L — AB (ref 0.5–1.9)
LACTIC ACID, VENOUS: 2.04 mmol/L — AB (ref 0.5–1.9)
Lactic Acid, Venous: 3.46 mmol/L (ref 0.5–1.9)

## 2017-12-20 LAB — URINALYSIS, ROUTINE W REFLEX MICROSCOPIC
BILIRUBIN URINE: NEGATIVE
Glucose, UA: NEGATIVE mg/dL
KETONES UR: NEGATIVE mg/dL
Nitrite: NEGATIVE
Protein, ur: 100 mg/dL — AB
Specific Gravity, Urine: <1.005 — ABNORMAL LOW (ref 1.005–1.030)
pH: 6.5 (ref 5.0–8.0)

## 2017-12-20 LAB — PREGNANCY, URINE: PREG TEST UR: NEGATIVE

## 2017-12-20 LAB — URINALYSIS, MICROSCOPIC (REFLEX)

## 2017-12-20 MED ORDER — SODIUM CHLORIDE 0.9 % IV BOLUS (SEPSIS)
1000.0000 mL | Freq: Once | INTRAVENOUS | Status: AC
  Administered 2017-12-20: 1000 mL via INTRAVENOUS

## 2017-12-20 MED ORDER — PHENAZOPYRIDINE HCL 100 MG PO TABS
100.0000 mg | ORAL_TABLET | Freq: Once | ORAL | Status: AC
  Administered 2017-12-20: 100 mg via ORAL
  Filled 2017-12-20: qty 1

## 2017-12-20 MED ORDER — ONDANSETRON HCL 4 MG/2ML IJ SOLN
4.0000 mg | Freq: Once | INTRAMUSCULAR | Status: AC
  Administered 2017-12-20: 4 mg via INTRAVENOUS
  Filled 2017-12-20: qty 2

## 2017-12-20 MED ORDER — PHENAZOPYRIDINE HCL 200 MG PO TABS: 200.0000 mg | ORAL_TABLET | Freq: Three times a day (TID) | ORAL | 0 refills | Status: AC

## 2017-12-20 MED ORDER — CEFTRIAXONE SODIUM 2 G IJ SOLR
2.0000 g | Freq: Once | INTRAMUSCULAR | Status: AC
  Administered 2017-12-20: 2 g via INTRAVENOUS
  Filled 2017-12-20: qty 2

## 2017-12-20 MED ORDER — FENTANYL CITRATE (PF) 100 MCG/2ML IJ SOLN
50.0000 ug | Freq: Once | INTRAMUSCULAR | Status: AC
  Administered 2017-12-20: 50 ug via INTRAVENOUS
  Filled 2017-12-20: qty 2

## 2017-12-20 MED ORDER — SODIUM CHLORIDE 0.9 % IV BOLUS (SEPSIS)
2000.0000 mL | Freq: Once | INTRAVENOUS | Status: AC
  Administered 2017-12-20: 2000 mL via INTRAVENOUS

## 2017-12-20 NOTE — Discharge Instructions (Signed)
Call Dr. Sabino GasserMullins at 9 AM for office appointment time to remove stent. Hold your metformin for the next 48 hours.

## 2017-12-20 NOTE — ED Notes (Signed)
Pt returned from US

## 2017-12-20 NOTE — ED Notes (Signed)
Pt voiced understanding of d/c instructions and follow up with urology in the morning.

## 2017-12-20 NOTE — ED Notes (Signed)
Patient transported to Ultrasound 

## 2017-12-20 NOTE — ED Triage Notes (Signed)
Pt states she was dx with kidney stone-stent placed 1/18-c/o cont'd pain and hematuria-grimacing-slow gait

## 2017-12-20 NOTE — ED Notes (Signed)
ED Provider at bedside. 

## 2017-12-20 NOTE — ED Provider Notes (Signed)
[facility] EMERGENCY DEPARTMENT Provider Note   CSN: [id number] Arrival date & time: 12/20/17  1920     History   Chief Complaint Chief Complaint  Patient presents with  . Nephrolithiasis    HPI Angel Bradshaw is a 52 y.o. female.  Chief complaint is pain from ureteral stent  HPI this is a 52 year old female.  She had a right UVJ 6 mm stone for over a week.  Seen by Dr. Augusto Gamble of [facility] u[facility].  She underwent lithotripsy with double-J stent placement and cystoscopy on Friday, 4 days ago.  Having minimal pain.  Did not like taking Percocet because it "made me feel funny".  Tylenol was controlling her symptoms.  Was supposed to pull her stent today.  She presented to triage complaining of pain.  She had a rather sudden onset of pain at home tonight.  Presents here.  She has not been febrile.  No vomiting.  Has had some dysuria.  No gross hematuria.  History of non-insulin-dependent diabetes.  Past Medical History:  Diagnosis Date  . Diabetes mellitus   . Kidney stone     Patient Active Problem List   Diagnosis Date Noted  . Escherichia coli sepsis (HCC) 08/28/2015  . Pyelonephritis 08/27/2015  . Left flank pain   . Blood poisoning   . Acute cholecystitis with chronic cholecystitis 10/19/2013  . UTI (urinary tract infection) 10/19/2013  . Incisional hernia - umbilical 10/19/2013  . Obesity (BMI 30-39.9) 10/19/2013  . Heartburn 10/19/2013  . DM (diabetes mellitus) (HCC) 12/16/2011    Past Surgical History:  Procedure Laterality Date  . CHOLECYSTECTOMY N/A 10/19/2013   Procedure: LAPAROSCOPIC CHOLECYSTECTOMY;  Surgeon: Adolph Pollack, MD;  Location: [facility];  Service: General;  Laterality: N/A;  . TUBAL LIGATION      OB History    Gravida Para Term Preterm AB Living   3 3 3     3    SAB TAB Ectopic Multiple Live Births                   Home Medications    Prior to Admission medications   Medication Sig Start Date End  Date Taking? Authorizing Provider  cefdinir (OMNICEF) 300 MG capsule Take 300 mg by mouth 2 (two) times daily.   Yes [provider]  Dulaglutide (TRULICITY [place]) Inject into the skin.   Yes [provider]  oxybutynin (DITROPAN) 5 MG tablet Take 5 mg by mouth 3 (three) times daily.   Yes [provider]  oxyCODONE-acetaminophen (PERCOCET/ROXICET) 5-325 MG tablet Take by mouth every 4 (four) hours as needed for severe pain.   Yes [provider]  aspirin EC 81 MG tablet Take 81 mg by mouth daily.    [provider]  fish oil-omega-3 fatty acids 1000 MG capsule Take 1 g by mouth daily.    [provider]  metFORMIN (GLUCOPHAGE) 1000 MG tablet Take 1,000 mg by mouth 2 (two) times daily with a meal.    [provider]  phenazopyridine (PYRIDIUM) 200 MG tablet Take 1 tablet (200 mg total) by mouth 3 (three) times daily. 12/20/17   [name], MD    Family History Family History  Problem Relation Age of Onset  . Diabetes Mother     Social History Social History   Tobacco Use  . Smoking status: Never Smoker  . Smokeless tobacco: Never Used  Substance Use Topics  . Alcohol use: No  . Drug use:  No     Allergies   Patient has no known allergies.   Review of Systems Review of Systems  Constitutional: Negative for appetite change, chills, diaphoresis, fatigue and fever.  HENT: Negative for mouth sores, sore throat and trouble swallowing.   Eyes: Negative for visual disturbance.  Respiratory: Negative for cough, chest tightness, shortness of breath and wheezing.   Cardiovascular: Negative for chest pain.  Gastrointestinal: Negative for abdominal distention, abdominal pain, diarrhea, nausea and vomiting.  Endocrine: Negative for polydipsia, polyphagia and polyuria.  Genitourinary: Positive for dysuria and flank pain. Negative for frequency and hematuria.  Musculoskeletal: Negative for gait problem.  Skin: Negative for color  change, pallor and rash.  Neurological: Negative for dizziness, syncope, light-headedness and headaches.  Hematological: Does not bruise/bleed easily.  Psychiatric/Behavioral: Negative for behavioral problems and confusion.     Physical Exam Updated Vital Signs BP 124/80   Pulse 98   Temp 99.1 F (37.3 C) (Rectal)   Resp 12   Ht 5\' 5"  (1.651 m)   Wt 84.8 kg (187 lb)   LMP 12/05/2017   SpO2 99%   BMI 31.12 kg/m   Physical Exam  Constitutional: She is oriented to person, place, and time. She appears well-developed and well-nourished. No distress.  No distress.  Pt had fentanyl iv and states pain is "gone".  HENT:  Head: Normocephalic.  Eyes: Conjunctivae are normal. Pupils are equal, round, and reactive to light. No scleral icterus.  Neck: Normal range of motion. Neck supple. No thyromegaly present.  Cardiovascular: Normal rate and regular rhythm. Exam reveals no gallop and no friction rub.  No murmur heard. Pulmonary/Chest: Effort normal and breath sounds normal. No respiratory distress. She has no wheezes. She has no rales.  Abdominal: Soft. Bowel sounds are normal. She exhibits no distension. There is no tenderness. There is no rebound.  Musculoskeletal: Normal range of motion.  Neurological: She is alert and oriented to person, place, and time.  Skin: Skin is warm and dry. No rash noted.  Psychiatric: She has a normal mood and affect. Her behavior is normal.     ED Treatments / Results  Labs (all labs ordered are listed, but only abnormal results are displayed) Labs Reviewed  CBC WITH DIFFERENTIAL/PLATELET - Abnormal; Notable for the following components:      Result Value   WBC 14.0 (*)    RBC 5.37 (*)    Platelets 432 (*)    Neutro Abs 10.3 (*)    All other components within normal limits  COMPREHENSIVE METABOLIC PANEL - Abnormal; Notable for the following components:   Glucose, Bld 170 (*)    Total Protein 8.7 (*)    All other components within normal limits    URINALYSIS, ROUTINE W REFLEX MICROSCOPIC - Abnormal; Notable for the following components:   APPearance HAZY (*)    Specific Gravity, Urine <1.005 (*)    Hgb urine dipstick LARGE (*)    Protein, ur 100 (*)    Leukocytes, UA TRACE (*)    All other components within normal limits  URINALYSIS, MICROSCOPIC (REFLEX) - Abnormal; Notable for the following components:   Bacteria, UA FEW (*)    Squamous Epithelial / LPF 0-5 (*)    All other components within normal limits  I-STAT CG4 LACTIC ACID, ED - Abnormal; Notable for the following components:   Lactic Acid, Venous 3.46 (*)    All other components within normal limits  I-STAT CG4 LACTIC ACID, ED - Abnormal; Notable for the following  components:   Lactic Acid, Venous 3.57 (*)    All other components within normal limits  I-STAT CG4 LACTIC ACID, ED - Abnormal; Notable for the following components:   Lactic Acid, Venous 2.04 (*)    All other components within normal limits  URINE CULTURE  CULTURE, BLOOD (ROUTINE X 2)  CULTURE, BLOOD (ROUTINE X 2)  PREGNANCY, URINE    EKG  EKG Interpretation None       Radiology US Renal  Result Date: 12/20/2017 CLINICAL DATA:  Right flank pain. Lithotripsy 4 days ago with double-J stent in place. Leukocytosis. Query hydronephrosis. EXAM: RENAL / URINARY TRACT ULTRASOUND COMPLETE COMPARISON:  CT from 12/15/2017 FINDINGS: Right Kidney: Length: 11.9 cm. Echogenicity within normal limits. Mild pelvocaliectasis. Linear 6 mm echogenic focus is seen in the upper pole of the right kidney possibly a distal right ureteral calculus fragment displaced back into the upper pole. Left Kidney: Length: 13.8 cm. Echogenicity within normal limits. Mild ectasia of the renal pelvis and upper pole calices. Bladder: Soft tissue edema in the expected location of the right UVJ at site of prior distal ureteral stone. Patent left ureter with ureteral jet demonstrated. Weak right ureteral jet noted. Tram track appearance of a  ureteral stent is suggested within the bladder and right renal collecting system. IMPRESSION: 6 mm echogenic focus in the upper pole the right kidney may reflect a displaced ureteral calculus given history of ureteral stent placement. There is eccentric soft tissue prominence in the expected location of the right UVJ possibly from inflammatory thickening. This results in a weak right-sided ureteral jet. Left ureter is patent. There is mild pelvocaliectasis bilaterally. Electronically Signed   By: Tollie Eth M.D.   On: 12/20/2017 21:49    Procedures Procedures (including critical care time)  Medications Ordered in ED Medications  fentaNYL (SUBLIMAZE) injection 50 mcg (50 mcg Intravenous Given 12/20/17 1953)  ondansetron (ZOFRAN) injection 4 mg (4 mg Intravenous Given 12/20/17 1952)  sodium chloride 0.9 % bolus 1,000 mL (0 mLs Intravenous Stopped 12/20/17 2046)  phenazopyridine (PYRIDIUM) tablet 100 mg (100 mg Oral Given 12/20/17 2027)  sodium chloride 0.9 % bolus 2,000 mL (0 mLs Intravenous Stopped 12/20/17 2112)  cefTRIAXone (ROCEPHIN) 2 g in dextrose 5 % 50 mL IVPB (0 g Intravenous Stopped 12/20/17 2112)  sodium chloride 0.9 % bolus 1,000 mL (0 mLs Intravenous Stopped 12/20/17 2302)  sodium chloride 0.9 % bolus 1,000 mL (0 mLs Intravenous Stopped 12/20/17 2302)     Initial Impression / Assessment and Plan / ED Course  I have reviewed the triage vital signs and the nursing notes.  Pertinent labs & imaging results that were available during my care of the patient were reviewed by me and considered in my medical decision making (see chart for details).    Patient presented appearing in distress tachycardic diaphoretic with dysuria.  Septic protocol initiated.  However patient is not febrile and oral or rectal temperatures.  Lactate is elevated however after aggressive hydration improves to 2.04.  Urine does not appear infected has been on antibiotics.  Was given IV Rocephin urine culture.  I  discussed the case with Dr. Daine Floras of [facility] regional urology.  Patient will be seen tomorrow morning for stent removal.  Ultrasound here shows decompression of the kidney.  Final Clinical Impressions(s) / ED Diagnoses   Final diagnoses:  Ureteral colic    ED Discharge Orders        Ordered    phenazopyridine (PYRIDIUM) 200 MG tablet  3 times daily     12/20/17 2330       [name], MD 12/20/17 [phone]

## 2017-12-20 NOTE — ED Notes (Signed)
amb to BR 

## 2017-12-22 LAB — URINE CULTURE

## 2017-12-25 LAB — CULTURE, BLOOD (ROUTINE X 2)
CULTURE: NO GROWTH
CULTURE: NO GROWTH
SPECIAL REQUESTS: ADEQUATE
SPECIAL REQUESTS: ADEQUATE

## 2022-09-30 ENCOUNTER — Other Ambulatory Visit: Payer: Self-pay

## 2022-09-30 ENCOUNTER — Emergency Department (HOSPITAL_BASED_OUTPATIENT_CLINIC_OR_DEPARTMENT_OTHER)
Admission: EM | Admit: 2022-09-30 | Discharge: 2022-09-30 | Disposition: A | Discharge status: Home / Self Care | Payer: Medicaid Other | Attending: Emergency Medicine | Admitting: Emergency Medicine

## 2022-09-30 ENCOUNTER — Emergency Department (HOSPITAL_BASED_OUTPATIENT_CLINIC_OR_DEPARTMENT_OTHER): Payer: Medicaid Other

## 2022-09-30 ENCOUNTER — Encounter (HOSPITAL_BASED_OUTPATIENT_CLINIC_OR_DEPARTMENT_OTHER): Payer: Self-pay | Admitting: Emergency Medicine

## 2022-09-30 DIAGNOSIS — E119 Type 2 diabetes mellitus without complications: Secondary | ICD-10-CM | POA: Insufficient documentation

## 2022-09-30 DIAGNOSIS — Z7982 Long term (current) use of aspirin: Secondary | ICD-10-CM | POA: Diagnosis not present

## 2022-09-30 DIAGNOSIS — Z794 Long term (current) use of insulin: Secondary | ICD-10-CM | POA: Diagnosis not present

## 2022-09-30 DIAGNOSIS — M79604 Pain in right leg: Secondary | ICD-10-CM | POA: Insufficient documentation

## 2022-09-30 DIAGNOSIS — M79605 Pain in left leg: Secondary | ICD-10-CM | POA: Diagnosis not present

## 2022-09-30 DIAGNOSIS — Z7984 Long term (current) use of oral hypoglycemic drugs: Secondary | ICD-10-CM | POA: Diagnosis not present

## 2022-09-30 NOTE — ED Triage Notes (Signed)
Bilateral leg pain , started with right calf pain and edema , was seen at [facility] , sent for DVT study .

## 2022-09-30 NOTE — ED Provider Notes (Signed)
[facility] EMERGENCY DEPARTMENT Provider Note   CSN: [id number] Arrival date & time: 09/30/22  1159     History  Chief Complaint  Patient presents with   Leg Pain    Bilateral     Angel Bradshaw is a 56 y.o. female.  56 year old female with a history of diabetes who presents to the emergency department with bilateral lower extremity pain.  Patient states on Monday she was walking and started experiencing sharp cramping in her right calf.  Says that she felt a knot afterwards.  Says that today she also started experiencing left-sided calf pain and went to urgent care for evaluation.  Feels that the right side may be slightly more swollen than the other.  No recent surgeries aside from a hysterectomy 3 months ago.  No history of DVT/PE.  Denies any shortness of breath.  Denies any other injuries or trauma to the area.  Says that she has been walking more than usual recently.  History obtained per the patient and her daughter.  States that they do not need Spanish interpreter and preferred English at this time.   Past Medical History:  Diagnosis Date   Diabetes mellitus    Kidney stone        Home Medications Prior to Admission medications   Medication Sig Start Date End Date Taking? Authorizing Provider  aspirin EC 81 MG tablet Take 81 mg by mouth daily.    [provider]  cefdinir (OMNICEF) 300 MG capsule Take 300 mg by mouth 2 (two) times daily.    [provider]  Dulaglutide (TRULICITY [place]) Inject into the skin.    [provider]  fish oil-omega-3 fatty acids 1000 MG capsule Take 1 g by mouth daily.    [provider]  metFORMIN (GLUCOPHAGE) 1000 MG tablet Take 1,000 mg by mouth 2 (two) times daily with a meal.    [provider]  oxybutynin (DITROPAN) 5 MG tablet Take 5 mg by mouth 3 (three) times daily.    [provider]  oxyCODONE-acetaminophen (PERCOCET/ROXICET) 5-325 MG tablet Take by mouth every 4  (four) hours as needed for severe pain.    [provider]  phenazopyridine (PYRIDIUM) 200 MG tablet Take 1 tablet (200 mg total) by mouth 3 (three) times daily. 12/20/17   Angel Porter, MD      Allergies    Losartan, Prunus persica, and Shrimp extract allergy skin test    Review of Systems   Review of Systems  Physical Exam Updated Vital Signs BP 122/75   Pulse 99   Temp 98.4 F (36.9 C) (Oral)   Resp 15   Ht 5\' 5"  (1.651 m)   Wt 83.9 kg   LMP 12/05/2017   SpO2 100%   BMI 30.79 kg/m  Physical Exam Vitals and nursing note reviewed.  Constitutional:      General: She is not in acute distress.    Appearance: She is well-developed.  HENT:     Head: Normocephalic and atraumatic.     Right Ear: External ear normal.     Left Ear: External ear normal.     Nose: Nose normal.  Eyes:     Extraocular Movements: Extraocular movements intact.     Conjunctiva/sclera: Conjunctivae normal.     Pupils: Pupils are equal, round, and reactive to light.  Cardiovascular:     Rate and Rhythm: Normal rate and regular rhythm.  Pulmonary:     Effort: Pulmonary effort is normal. No respiratory  distress.  Musculoskeletal:        General: No swelling.     Cervical back: Normal range of motion and neck supple.     Right lower leg: No edema.     Left lower leg: No edema.     Comments: Motor: Muscle bulk and tone are normal. Strength is 5/5 in ankle dorsiflexion and plantar flexion bilaterally. Full strength of great toe dorsiflexion bilaterally.  Sensory: Intact sensation to light touch in L2 though S1 dermatomes bilaterally.  Dorsalis pedis pulses 2+ bilaterally.  Both feet appear warm and well perfused.  Skin:    General: Skin is warm and dry.     Capillary Refill: Capillary refill takes less than 2 seconds.  Neurological:     Mental Status: She is alert and oriented to person, place, and time. Mental status is at baseline.  Psychiatric:        Mood and Affect: Mood normal.      ED Results / Procedures / Treatments   Labs (all labs ordered are listed, but only abnormal results are displayed) Labs Reviewed - No data to display  EKG None  Radiology US Venous Img Lower Bilateral (DVT)  Result Date: 09/30/2022 CLINICAL DATA:  Right-greater-than-left leg pain EXAM: BILATERAL LOWER EXTREMITY VENOUS DOPPLER ULTRASOUND TECHNIQUE: Gray-scale sonography with compression, as well as color and duplex ultrasound, were performed to evaluate the deep venous system(s) from the level of the common femoral vein through the popliteal and proximal calf veins. COMPARISON:  None Available. FINDINGS: VENOUS Normal compressibility of the bilateral common femoral, superficial femoral, and popliteal veins, as well as the visualized calf veins. Visualized portions of profunda femoral vein and great saphenous vein unremarkable. No filling defects to suggest DVT on grayscale or color Doppler imaging. Doppler waveforms show normal direction of venous flow, normal respiratory plasticity and response to augmentation. OTHER None. Limitations: none IMPRESSION: No evidence of DVT in the bilateral lower extremities. Electronically Signed   By: Allegra Lai M.D.   On: 09/30/2022 13:41    Procedures Procedures   Medications Ordered in ED Medications - No data to display  ED Course/ Medical Decision Making/ A&P                           Medical Decision Making  Angel Bradshaw is a 55 y.o. female with comorbidities that complicate the patient evaluation including diabetes who presents with chief complaint of several days of right lower extremity pain and 1 day of left lower extremity pain.  This patient presents to the ED for concern of complaints listed in HPI, this involves an extensive number of treatment options, and is a complaint that carries with it a high risk of complications and morbidity. Disposition including potential need for admission considered.   Initial Ddx:  DVT, PE,  MSK strain, muscle cramping  MDM:  Initially was concerned about possible DVT given the right lower extremity pain and subjective swelling of the right calf.  Will evaluate for this but no symptoms of a PE at this time.  If this is negative feel the patient may be having muscle cramping and soreness from her increased walking recently.  Plan:  Duplex ultrasound  ED Summary/Re-evaluation:  Patient had bilateral lower extremity ultrasound which did not show evidence of DVT.  We will have her follow-up with her primary doctor and sports medicine.  Was advised on symptomatic treatment of her muscle cramping and strain.  Also informed  that she should stay well-hydrated and use electrolyte drinks such as Gatorade to prevent worsening cramping.  Dispo: DC Home. Return precautions discussed including, but not limited to, those listed in the AVS. Allowed pt time to ask questions which were answered fully prior to dc.   Additional history obtained from daughter Records reviewed Outpatient Clinic Notes I have reviewed the patients home medications and made adjustments as needed  Final Clinical Impression(s) / ED Diagnoses Final diagnoses:  Bilateral leg pain    Rx / DC Orders ED Discharge Orders     None         Fransico Meadow, MD 09/30/22 [phone]

## 2022-09-30 NOTE — Discharge Instructions (Addendum)
Llame a su mdico de atencin primaria en 2 a 3 das para analizar sus sntomas.  Haga un seguimiento con medicina deportiva si sus sntomas no mejoran en [place].  ----  Please call your primary doctor in 2 to 3 days to discuss your symptoms.  Follow-up with sports medicine if your symptoms do not improve in several days.
